# Patient Record
Sex: Male | Born: 1940 | Race: White | Hispanic: No | Marital: Married | Smoking: Former smoker
Health system: Southern US, Community
[De-identification: ages and names within clinical notes are randomized; demographics above are authoritative.]

## PROBLEM LIST (undated history)

## (undated) DIAGNOSIS — R05 Cough: Secondary | ICD-10-CM

## (undated) DIAGNOSIS — R059 Cough, unspecified: Secondary | ICD-10-CM

## (undated) DIAGNOSIS — I10 Essential (primary) hypertension: Secondary | ICD-10-CM

## (undated) DIAGNOSIS — I5032 Chronic diastolic (congestive) heart failure: Secondary | ICD-10-CM

## (undated) DIAGNOSIS — I495 Sick sinus syndrome: Secondary | ICD-10-CM

## (undated) DIAGNOSIS — C61 Malignant neoplasm of prostate: Secondary | ICD-10-CM

## (undated) DIAGNOSIS — J329 Chronic sinusitis, unspecified: Secondary | ICD-10-CM

## (undated) DIAGNOSIS — K219 Gastro-esophageal reflux disease without esophagitis: Secondary | ICD-10-CM

## (undated) DIAGNOSIS — I4892 Unspecified atrial flutter: Secondary | ICD-10-CM

## (undated) DIAGNOSIS — I2510 Atherosclerotic heart disease of native coronary artery without angina pectoris: Secondary | ICD-10-CM

## (undated) DIAGNOSIS — J45909 Unspecified asthma, uncomplicated: Secondary | ICD-10-CM

## (undated) DIAGNOSIS — Z972 Presence of dental prosthetic device (complete) (partial): Secondary | ICD-10-CM

## (undated) DIAGNOSIS — J209 Acute bronchitis, unspecified: Secondary | ICD-10-CM

## (undated) HISTORY — DX: Acute bronchitis, unspecified: J20.9

## (undated) HISTORY — PX: PACEMAKER INSERTION: SHX728

## (undated) HISTORY — DX: Cough, unspecified: R05.9

## (undated) HISTORY — PX: SINUS IRRIGATION: SHX2411

## (undated) HISTORY — DX: Cough: R05

## (undated) HISTORY — DX: Chronic sinusitis, unspecified: J32.9

---

## 2004-10-12 ENCOUNTER — Inpatient Hospital Stay: Payer: Self-pay

## 2004-10-12 ENCOUNTER — Other Ambulatory Visit: Payer: Self-pay

## 2006-05-13 HISTORY — PX: CARDIAC CATHETERIZATION: SHX172

## 2006-07-12 ENCOUNTER — Other Ambulatory Visit: Payer: Self-pay

## 2006-07-13 ENCOUNTER — Inpatient Hospital Stay: Payer: Self-pay | Admitting: Internal Medicine

## 2006-11-07 ENCOUNTER — Ambulatory Visit: Payer: Self-pay | Admitting: Internal Medicine

## 2006-11-17 ENCOUNTER — Other Ambulatory Visit: Payer: Self-pay

## 2006-11-17 ENCOUNTER — Inpatient Hospital Stay: Payer: Self-pay | Admitting: Internal Medicine

## 2006-12-02 ENCOUNTER — Other Ambulatory Visit: Payer: Self-pay

## 2006-12-02 ENCOUNTER — Inpatient Hospital Stay: Payer: Self-pay | Admitting: Internal Medicine

## 2007-02-04 ENCOUNTER — Other Ambulatory Visit: Payer: Self-pay

## 2007-02-04 ENCOUNTER — Emergency Department: Payer: Self-pay

## 2007-11-12 ENCOUNTER — Ambulatory Visit: Payer: Self-pay | Admitting: Physician Assistant

## 2008-04-20 ENCOUNTER — Emergency Department: Payer: Self-pay | Admitting: Unknown Physician Specialty

## 2008-07-06 ENCOUNTER — Ambulatory Visit: Payer: Self-pay | Admitting: Cardiology

## 2008-10-10 IMAGING — CR DG CHEST 1V PORT
1 series · 1 of 1 positions shown · non-contrast
Comparison: none

REASON FOR EXAM: Chest Pain
COMMENTS:

PROCEDURE:     DXR - DXR PORTABLE CHEST SINGLE VIEW  - November 17, 2006  [DATE]
RESULT:      No acute cardiopulmonary disease.

[view not recorded]
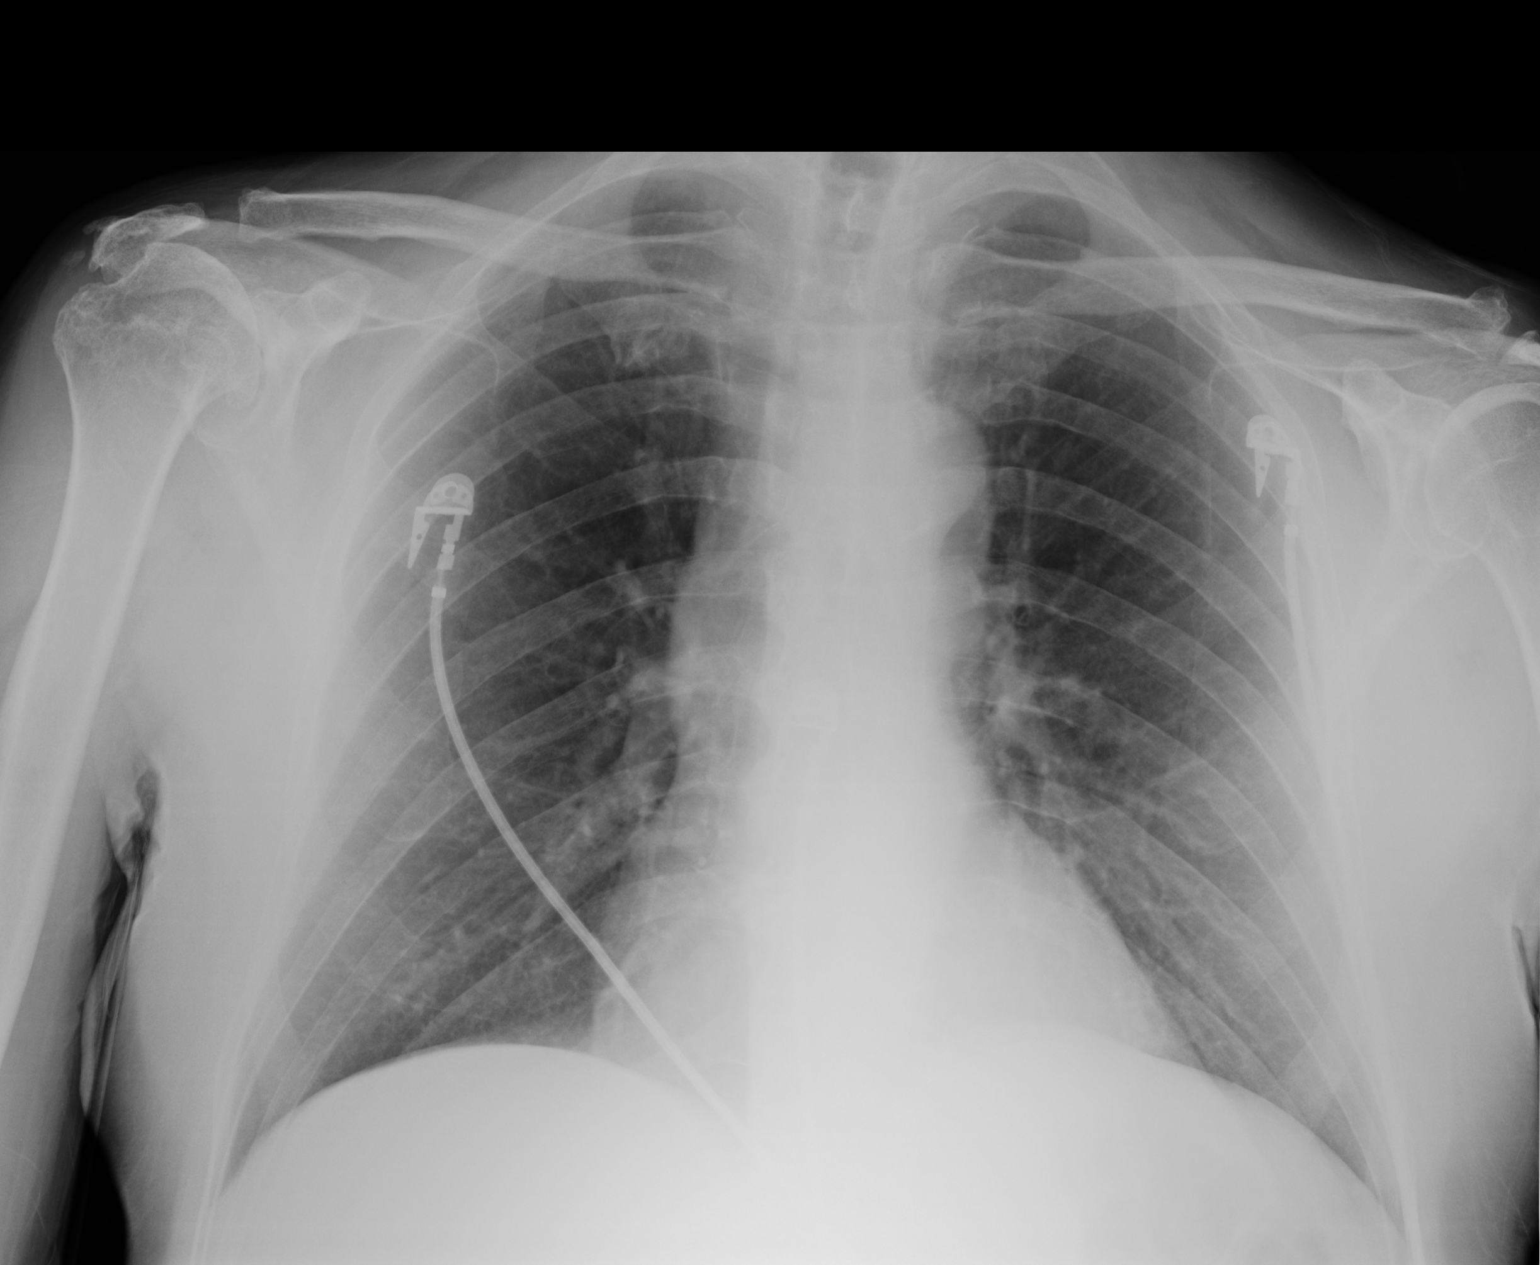

[1 of 1 positions shown; findings below may reference images not displayed]

IMPRESSION: No acute cardiopulmonary disease.

## 2009-05-13 DIAGNOSIS — Z950 Presence of cardiac pacemaker: Secondary | ICD-10-CM

## 2009-05-13 HISTORY — DX: Presence of cardiac pacemaker: Z95.0

## 2009-05-13 HISTORY — PX: INSERT / REPLACE / REMOVE PACEMAKER: SUR710

## 2009-06-15 ENCOUNTER — Ambulatory Visit: Payer: Self-pay | Admitting: Cardiology

## 2009-06-16 ENCOUNTER — Ambulatory Visit: Payer: Self-pay | Admitting: Cardiology

## 2009-08-04 ENCOUNTER — Ambulatory Visit: Payer: Self-pay | Admitting: Cardiology

## 2009-08-10 ENCOUNTER — Ambulatory Visit: Payer: Self-pay | Admitting: Cardiology

## 2011-12-31 ENCOUNTER — Ambulatory Visit: Payer: Self-pay | Admitting: Otolaryngology

## 2012-01-27 ENCOUNTER — Ambulatory Visit: Payer: Self-pay | Admitting: Otolaryngology

## 2012-02-03 ENCOUNTER — Ambulatory Visit: Payer: Self-pay | Admitting: Otolaryngology

## 2012-02-04 LAB — PATHOLOGY REPORT

## 2012-06-01 ENCOUNTER — Ambulatory Visit: Payer: Self-pay | Admitting: Urology

## 2012-06-01 LAB — BASIC METABOLIC PANEL
Calcium, Total: 9.2 mg/dL (ref 8.5–10.1)
Co2: 27 mmol/L (ref 21–32)
EGFR (Non-African Amer.): >60
Potassium: 4.4 mmol/L (ref 3.5–5.1)
Sodium: 139 mmol/L (ref 136–145)

## 2012-06-01 LAB — CBC WITH DIFFERENTIAL/PLATELET
Basophil #: 0.1 10*3/uL (ref 0.0–0.1)
Eosinophil #: 1 10*3/uL — ABNORMAL HIGH (ref 0.0–0.7)
HCT: 41.3 % (ref 40.0–52.0)
HGB: 14 g/dL (ref 13.0–18.0)
Lymphocyte %: 26.7 %
Monocyte #: 0.7 x10 3/mm (ref 0.2–1.0)
Monocyte %: 10.6 %
Neutrophil %: 47.6 %
Platelet: 262 10*3/uL (ref 150–440)
RBC: 4.69 10*6/uL (ref 4.40–5.90)
RDW: 13.9 % (ref 11.5–14.5)
WBC: 6.7 10*3/uL (ref 3.8–10.6)

## 2012-06-23 ENCOUNTER — Ambulatory Visit: Payer: Self-pay | Admitting: Otolaryngology

## 2012-07-06 ENCOUNTER — Encounter: Payer: Self-pay | Admitting: Pulmonary Disease

## 2012-07-07 ENCOUNTER — Encounter: Payer: Self-pay | Admitting: Pulmonary Disease

## 2012-07-07 ENCOUNTER — Ambulatory Visit (INDEPENDENT_AMBULATORY_CARE_PROVIDER_SITE_OTHER): Payer: PRIVATE HEALTH INSURANCE | Admitting: Pulmonary Disease

## 2012-07-07 VITALS — BP 126/80 | HR 63 | Temp 98.0°F | Ht 71.0 in | Wt 197.0 lb

## 2012-07-07 DIAGNOSIS — R059 Cough, unspecified: Secondary | ICD-10-CM

## 2012-07-07 DIAGNOSIS — R05 Cough: Secondary | ICD-10-CM

## 2012-07-07 DIAGNOSIS — K219 Gastro-esophageal reflux disease without esophagitis: Secondary | ICD-10-CM

## 2012-07-07 NOTE — Progress Notes (Signed)
Subjective:    Patient ID: Billy Glass, male    DOB: [birth year], 72 y.o.   MRN: [id number]  HPI  Billy Glass is a very pleasant 72 year old male referred to [place] by Dr. Andee Poles for evaluation of cough. He had a normal childhood without respiratory problems and has never been diagnosed with a respiratory illness until 2 years ago. He smoked less than one pack of cigarettes daily for 5-6 years and quit 44 years ago. 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  This workup did not involve significant exposure to dust, chemicals, or fumes. He enjoys performing yard work which sometimes exacerbates his cough and causes him to be exposed to a lot of dust. Otherwise he has no other significant hobbies which involved him being exposed to chemicals, dust, or fumes. He states that for the last 2 years he has had an ongoing cough which has been managed by both his primary care physician and Dr. Andee Poles. During this time he has been diagnosed with bronchitis on several occasions when he has been producing clear sputum. He is also been diagnosed with chronic sinusitis which has improved after a endoscopic sinus procedure in the late summer of 2013. His sinus symptoms have persisted however despite a nasal steroid and Singulair. He states that the cough has been nearly constant with intermittent flares with clear sputum production. It is worse when he lies down flat at night and lasts for about an hour. He occasionally produces sputum in the mornings. It is worse with heartburn and it is worse when his sinus symptoms are flaring. Most recently he was treated with Levaquin and was started on [facility] for cough. He states that the antibiotics helped in his cough has now improved significantly.   Past Medical History  Diagnosis Date  . Chronic sinusitis   . Acute bronchitis   . Cough      Family History  Problem Relation Age of Onset  . Hypertension Mother       History   Social History  . Marital Status: Married    Spouse Name: N/A    Number of Children: N/A  . Years of Education: N/A   Occupational History  . Not on file.   Social History Main Topics  . Smoking status: Former Smoker -- 1.00 packs/day for 6 years    Types: Cigarettes    Quit date: 05/13/1968  . Smokeless tobacco: Never Used  . Alcohol Use: No  . Drug Use: No  . Sexually Active: Not on file   Other Topics Concern  . Not on file   Social History Narrative  . No narrative on file     No Known Allergies   No outpatient prescriptions prior to visit.   No facility-administered medications prior to visit.      Review of Systems  Constitutional: Negative for fever, chills, activity change and appetite change.  HENT: Positive for congestion and postnasal drip. Negative for hearing loss, ear pain, rhinorrhea, sneezing, neck pain, neck stiffness and sinus pressure.   Eyes: Negative for redness, itching and visual disturbance.  Respiratory: Positive for cough. Negative for chest tightness, shortness of breath and wheezing.   Cardiovascular: Negative for chest pain, palpitations and leg swelling.  Gastrointestinal: Negative for nausea, vomiting, abdominal pain, diarrhea, constipation, blood in stool and abdominal distention.  Musculoskeletal: Negative for myalgias, joint swelling, arthralgias and gait problem.  Skin: Negative for rash.  Neurological: Negative for dizziness, light-headedness, numbness  and headaches.  Hematological: Does not bruise/bleed easily.  Psychiatric/Behavioral: Negative for confusion and dysphoric mood.       Objective:   Physical Exam  Filed Vitals:   07/07/12 1333  BP: 126/80  Pulse: 63  Temp: 98 F (36.7 C)  TempSrc: Oral  Height: 5\' 11"  (1.803 m)  Weight: 197 lb (89.359 kg)  SpO2: 96%   Gen: well appearing, no acute distress HEENT: NCAT, PERRL, EOMi, OP clear, neck supple without masses PULM: CTA B CV: RRR, mid systolic  murmur RUSB, no JVD AB: BS+, soft, nontender, no hsm Ext: warm, trace edema R > L, no clubbing, no cyanosis Derm: no rash or skin breakdown Neuro: A&Ox4, CN II-XII intact, strength 5/5 in all 4 extremities  04/2013 CXR >> emphysema upper lobes bilaterally; pacemaker in place, flat diaphragms     Assessment & Plan:   Cough Billy Glass chest x-ray is suggestive of emphysema but he has very little smoking history to explain this. Furthermore his lung function testing shows no clear evidence of obstruction. Based on this I think the most likely explanation for his cough is some degree of asthma with chronic sinusitis and untreated heartburn contributing.  Plan: -I agree with the combination steroid and long-acting beta agonist prescribed by Dr. Andee Poles. I have asked him to continue taking this. -Start over-the-counter Prilosec for heartburn which is likely contributing to cough -He was given instructions on GERD lifestyle modifications -Followup 6 weeks  Acid reflux Start Prilosec Lifestyle modifications    Updated Medication List Outpatient Encounter Prescriptions as of 07/07/2012  Medication Sig Dispense Refill  . ALPRAZolam (XANAX) 0.25 MG tablet Take 0.25 mg by mouth at bedtime as needed for sleep.      Marland Kitchen amLODipine (NORVASC) 5 MG tablet Take 5 mg by mouth daily.      . fluticasone (FLONASE) 50 MCG/ACT nasal spray Place 2 sprays into the nose daily.      Marland Kitchen gabapentin (NEURONTIN) 300 MG capsule Take 1 capsule by mouth 3 (three) times daily.      . montelukast (SINGULAIR) 10 MG tablet Take 1 tablet by mouth daily.      . simvastatin (ZOCOR) 40 MG tablet Take 1 tablet by mouth daily.      . tamsulosin (FLOMAX) 0.4 MG CAPS Take 1 capsule by mouth daily.       No facility-administered encounter medications on file as of 07/07/2012.

## 2012-07-07 NOTE — Assessment & Plan Note (Signed)
Start Prilosec Lifestyle modifications

## 2012-07-07 NOTE — Assessment & Plan Note (Addendum)
Billy Glass chest x-ray is suggestive of emphysema but he has very little smoking history to explain this. Furthermore his lung function testing shows no clear evidence of obstruction. Based on this I think the most likely explanation for his cough is some degree of asthma with chronic sinusitis and untreated heartburn contributing.  Plan: -I agree with the combination steroid and long-acting beta agonist prescribed by Dr. Andee Poles. I have asked him to continue taking this. -Start over-the-counter Prilosec for heartburn which is likely contributing to cough -He was given instructions on GERD lifestyle modifications -Followup 6 weeks

## 2012-07-07 NOTE — Patient Instructions (Signed)
Take over the counter prilosec (20mg  daily) every day until I see you next.  This is an antacid that will take 1-2 weeks to start working Follow the lifestyle modifications for acid reflux we have provided you Keep taking the [facility] and the other medications prescribed by Dr. Andee Poles  We will see you back in 6 weeks or sooner if needed

## 2012-07-07 NOTE — Progress Notes (Signed)
  Subjective:    Patient ID: Billy Glass, male    DOB: [birth year], 72 y.o.   MRN: [id number]  HPI    Review of Systems  Constitutional: Negative for fever and unexpected weight change.  HENT: Positive for congestion. Negative for ear pain, nosebleeds, sore throat, rhinorrhea, sneezing, trouble swallowing, dental problem, postnasal drip and sinus pressure.   Eyes: Negative for redness and itching.  Respiratory: Positive for wheezing. Negative for cough, chest tightness and shortness of breath.   Cardiovascular: Positive for leg swelling. Negative for palpitations.  Gastrointestinal: Negative for nausea and vomiting.  Genitourinary: Negative for dysuria.  Musculoskeletal: Negative for joint swelling.  Skin: Negative for rash.  Neurological: Negative for headaches.  Hematological: Does not bruise/bleed easily.  Psychiatric/Behavioral: Negative for dysphoric mood. The patient is not nervous/anxious.        Objective:   Physical Exam        Assessment & Plan:

## 2012-08-11 ENCOUNTER — Other Ambulatory Visit: Payer: Self-pay | Admitting: Pulmonary Disease

## 2012-08-11 ENCOUNTER — Encounter: Payer: Self-pay | Admitting: Pulmonary Disease

## 2012-08-11 ENCOUNTER — Ambulatory Visit (INDEPENDENT_AMBULATORY_CARE_PROVIDER_SITE_OTHER): Payer: Medicare Other | Admitting: Pulmonary Disease

## 2012-08-11 VITALS — BP 124/66 | HR 81 | Temp 97.8°F | Ht 71.0 in | Wt 187.0 lb

## 2012-08-11 DIAGNOSIS — R05 Cough: Secondary | ICD-10-CM

## 2012-08-11 DIAGNOSIS — G4733 Obstructive sleep apnea (adult) (pediatric): Secondary | ICD-10-CM

## 2012-08-11 DIAGNOSIS — Z9989 Dependence on other enabling machines and devices: Secondary | ICD-10-CM | POA: Insufficient documentation

## 2012-08-11 NOTE — Progress Notes (Signed)
Subjective:    Patient ID: Billy Glass, male    DOB: July 11, 1940, 72 y.o.   MRN: [id number]  Synopsis: Ahren Pettinger first saw the [facility] pulmonary clinic in February 2014 for evaluation of shortness of breath and cough. He had normal spirometry but improvement of his cough and symptom on Dulera.  He also reported improvement when he took albuterol.    HPI  08/11/2012 ROV - Mr. Fout says that his symptoms have improved since the last visit. His cough has improved but is still persistent somewhat. He has shortness of breath at night only when lying flat and using his CPAP machine. This has occurred 2-3 times a week for the last 6 weeks. He typically feels tightness in his chest and wheezing. He says that he will sit up, and uses albuterol inhaler and then feel relief.  On most evenings he has significant sinus congestion that makes his breathing worse. He frequently has to clear out significant amounts of mucus from his nose in the morning. He reports no shortness of breath during the daytime. He has not had chest pain in his chronic leg swelling is unchanged. He questions whether or not he is taking the [facility] appropriately as he says sometimes he doesn't feel that it's going into his lungs. He uses CPAP every night, but he says it has not been adjusted in 5 years.   Past Medical History  Diagnosis Date  . Chronic sinusitis   . Acute bronchitis   . Cough       Review of Systems  Constitutional: Negative for fever, chills and fatigue.  HENT: Positive for congestion, rhinorrhea and postnasal drip.   Respiratory: Positive for cough and shortness of breath. Negative for wheezing.   Cardiovascular: Positive for leg swelling. Negative for chest pain and palpitations.       Objective:   Physical Exam  Filed Vitals:   08/11/12 1347  BP: 124/66  Pulse: 81  Temp: 97.8 F (36.6 C)  TempSrc: Oral  Height: 5\' 11"  (1.803 m)  Weight: 187 lb (84.823 kg)  SpO2: 96%     Gen: well appearing, no acute distress HEENT: NCAT, EOMi, OP clear, TM's clear PULM: CTA B CV: RRR, slight systolic murmur, no JVD AB: BS+, soft, nontender, no hsm Ext: warm, compression stockings, no clubbing, no cyanosis      Assessment & Plan:   Cough This has improved somewhat since the last visit with [place].  I believe it is due to asthma and post nasal drip.  He and I question whether or not he is using his Dulera inhaler appropriately.  Plan: -start using Dulera with a spacer -see OSA -add nasal rinses -add chlortrimeton -add over the counter decongestant  OSA on CPAP He has been "smothering" at night when lying flat.  He says that a recent echocardiogram was normal, and he is not volume overloaded so I doubt heart failure here.  I suspect that this dyspnea is due in part to asthma (with ineffective delivery of Dulera), ongoing sinus congestion, and perhaps an inappropriate CPAP dose.  His pressure on CPAP is apparently only 3.5cm of water and has not been adjusted in the last 5 years.  Plan: -autotitrating CPAP machine at home with downloadable recording -use Dulera with a spacer -see cough    Updated Medication List Outpatient Encounter Prescriptions as of 08/11/2012  Medication Sig Dispense Refill  . ALPRAZolam (XANAX) 0.25 MG tablet 1/2 tablet twice daily      . amLODipine (  NORVASC) 5 MG tablet Take 5 mg by mouth daily.      . fluticasone (FLONASE) 50 MCG/ACT nasal spray Place 2 sprays into the nose daily.      Marland Kitchen gabapentin (NEURONTIN) 300 MG capsule Take 1 capsule by mouth 3 (three) times daily.      . mometasone-formoterol (DULERA) 100-5 MCG/ACT AERO Inhale 2 puffs into the lungs 2 (two) times daily.      Marland Kitchen omeprazole (PRILOSEC OTC) 20 MG tablet Take 20 mg by mouth daily.      . simvastatin (ZOCOR) 40 MG tablet Take 1 tablet by mouth daily.      . tamsulosin (FLOMAX) 0.4 MG CAPS Take 1 capsule by mouth daily.      . [DISCONTINUED] montelukast (SINGULAIR) 10  MG tablet Take 1 tablet by mouth daily.       No facility-administered encounter medications on file as of 08/11/2012.

## 2012-08-11 NOTE — Patient Instructions (Signed)
Use Neil Med rinses with distilled water at least twice per day using the instructions on the package. 1/2 hour after using the [facility] Med rinse, use Flonase two puffs in each nostril once per day. Use chlortrimeton and an over the counter decongestant (phenylephrine or pseudophed) as needed for the sinus congestion. You can use saline gel in your nostrils before going to bed Use a spacer with your Dulera inhaler twice a day We will set up the auto-titrating CPAP machine to use in your home to help get your CPAP pressure correct.   We will see you back in 3-4 months or sooner if needed

## 2012-08-11 NOTE — Assessment & Plan Note (Addendum)
This has improved somewhat since the last visit with [place].  I believe it is due to asthma and post nasal drip.  He and I question whether or not he is using his Dulera inhaler appropriately.  Plan: -start using Dulera with a spacer -see OSA -add nasal rinses -add chlortrimeton -add over the counter decongestant

## 2012-08-11 NOTE — Assessment & Plan Note (Signed)
He has been "smothering" at night when lying flat.  He says that a recent echocardiogram was normal, and he is not volume overloaded so I doubt heart failure here.  I suspect that this dyspnea is due in part to asthma (with ineffective delivery of Dulera), ongoing sinus congestion, and perhaps an inappropriate CPAP dose.  His pressure on CPAP is apparently only 3.5cm of water and has not been adjusted in the last 5 years.  Plan: -autotitrating CPAP machine at home with downloadable recording -use Dulera with a spacer -see cough

## 2012-08-12 ENCOUNTER — Ambulatory Visit: Payer: Medicare Other | Admitting: Pulmonary Disease

## 2012-08-18 ENCOUNTER — Ambulatory Visit: Payer: Medicare Other | Admitting: Pulmonary Disease

## 2012-12-08 ENCOUNTER — Telehealth: Payer: Self-pay | Admitting: Pulmonary Disease

## 2012-12-08 NOTE — Telephone Encounter (Signed)
Spoke with Burna Mortimer at [facility] about pt compliance with CPAP. [facility] sent order for purchase of CPAP--Medicare did not receive compliance report.  Cannot find in chart where you states that pt has been compliant with CPAP. Medicare is needing either documentation from you on 08/2012 visit that pt is complaint with CPAP or a D/L of compliance from [facility]. Medicare has already spoken with [facility] and they do not have a D/L of compliance to give them  Please advise Dr Kendrick Fries. Thanks.

## 2012-12-08 NOTE — Telephone Encounter (Signed)
In our last visit I wanted him to have a Resmed 9 Autotitrating CPAP set up at home with 5-20cm H20 and 4 week download; I haven't received a download. Can you check to see if any of this has been set up?  That would be how I would get his compliance report.  Thanks

## 2012-12-09 NOTE — Telephone Encounter (Signed)
I have sent [facility] a staff message to get this faxed over to [place]. Will forward to [place] to look out for results.

## 2012-12-15 NOTE — Telephone Encounter (Signed)
I do not see anything here in [place] in triage until results are received

## 2012-12-15 NOTE — Telephone Encounter (Signed)
Please advise if results have been received, thanks!!

## 2012-12-15 NOTE — Telephone Encounter (Signed)
I have not seen these results. I have looked through Dr. Ulyses Jarred "look at" and "scan folder" and it's not there. Will send this message to Verlon Au to see if this has been taken to [facility] for BQ today.  Another staff message will be sent to [facility] to get these results faxed over to [place].

## 2012-12-17 NOTE — Telephone Encounter (Signed)
These results are in BQ's look at folder. When there is another [facility] day, he will get to look at it then.

## 2012-12-17 NOTE — Telephone Encounter (Signed)
There is nothing in triage. Will forward to [place] to see if it was ever received.

## 2012-12-22 ENCOUNTER — Telehealth: Payer: Self-pay | Admitting: *Deleted

## 2012-12-22 ENCOUNTER — Encounter: Payer: Self-pay | Admitting: Pulmonary Disease

## 2012-12-22 DIAGNOSIS — G4733 Obstructive sleep apnea (adult) (pediatric): Secondary | ICD-10-CM

## 2012-12-22 NOTE — Telephone Encounter (Signed)
I think 7-11 is better, thanks

## 2012-12-22 NOTE — Telephone Encounter (Signed)
The order had been sent for auto device 7-20  Should we change this to 7-11  Please advise, and then I will call and discuss with the pt Thanks!

## 2012-12-22 NOTE — Telephone Encounter (Signed)
Message copied by Christen Butter on Tue Dec 22, 2012 10:14 AM ------      Message from: Max Fickle B      Created: Tue Dec 22, 2012  8:56 AM       L,            Can we make sure that his Autotitrating CPAP settings are 7-11cm H20.            Also, please remind him to keep his CPAP machine on as long as possible every night.  His average usage each night was only about three hours and it really should be higher.            Thanks,      B ------

## 2012-12-23 NOTE — Telephone Encounter (Signed)
Spoke with pt and notified of recs per BQ and he verbalized understanding  Order sent to [facility] to decrease pressure  The pt c/o having increased dyspnea OV with BQ scheduled for 12/29/12 Call sooner if needed

## 2012-12-29 ENCOUNTER — Ambulatory Visit (INDEPENDENT_AMBULATORY_CARE_PROVIDER_SITE_OTHER)
Admission: RE | Admit: 2012-12-29 | Discharge: 2012-12-29 | Disposition: A | Discharge status: Home / Self Care | Payer: PRIVATE HEALTH INSURANCE | Source: Ambulatory Visit | Attending: Pulmonary Disease | Admitting: Pulmonary Disease

## 2012-12-29 ENCOUNTER — Ambulatory Visit (INDEPENDENT_AMBULATORY_CARE_PROVIDER_SITE_OTHER): Payer: PRIVATE HEALTH INSURANCE | Admitting: Pulmonary Disease

## 2012-12-29 ENCOUNTER — Encounter: Payer: Self-pay | Admitting: Pulmonary Disease

## 2012-12-29 ENCOUNTER — Encounter: Payer: Self-pay | Admitting: *Deleted

## 2012-12-29 VITALS — BP 122/70 | HR 65 | Ht 71.0 in | Wt 182.0 lb

## 2012-12-29 DIAGNOSIS — R05 Cough: Secondary | ICD-10-CM

## 2012-12-29 DIAGNOSIS — G4733 Obstructive sleep apnea (adult) (pediatric): Secondary | ICD-10-CM

## 2012-12-29 DIAGNOSIS — J45901 Unspecified asthma with (acute) exacerbation: Secondary | ICD-10-CM

## 2012-12-29 MED ORDER — DOXYCYCLINE HYCLATE 50 MG PO CAPS: 100.0000 mg | ORAL_CAPSULE | Freq: Two times a day (BID) | ORAL | Status: AC

## 2012-12-29 MED ORDER — HYDROCOD POLST-CHLORPHEN POLST 10-8 MG/5ML PO LQCR: 5.0000 mL | Freq: Two times a day (BID) | ORAL | Status: DC | PRN

## 2012-12-29 MED ORDER — PREDNISONE 10 MG PO TABS: ORAL_TABLET | ORAL | Status: DC

## 2012-12-29 NOTE — Assessment & Plan Note (Signed)
Continue CPAP at 9cm H20

## 2012-12-29 NOTE — Patient Instructions (Signed)
Take the prednisone taper as written Take the doxycycline 100mg  twice a day for 7 days, take it with yogurt We will send you for a chest x-ray at the [facility] office  Use over the counter phenylephrine and saline sprays or Lloyd Huger med rinse to help with the sinus congestion  Use the tussionex at night to help with the cough.  We will see you back in 3 weeks or sooner if needed

## 2012-12-29 NOTE — Progress Notes (Signed)
Subjective:    Patient ID: Billy Glass, male    DOB: [birth year], 72 y.o.   MRN: [id number]  Synopsis: Billy Glass first saw the [facility] pulmonary clinic in February 2014 for evaluation of shortness of breath and cough. He had normal spirometry but improvement of his cough and symptom on Dulera.  He also reported improvement when he took albuterol.    HPI   08/11/2012 ROV - Billy Glass says that his symptoms have improved since the last visit. His cough has improved but is still persistent somewhat. He has shortness of breath at night only when lying flat and using his CPAP machine. This has occurred 2-3 times a week for the last 6 weeks. He typically feels tightness in his chest and wheezing. He says that he will sit up, and uses albuterol inhaler and then feel relief.  On most evenings he has significant sinus congestion that makes his breathing worse. He frequently has to clear out significant amounts of mucus from his nose in the morning. He reports no shortness of breath during the daytime. He has not had chest pain in his chronic leg swelling is unchanged. He questions whether or not he is taking the [facility] appropriately as he says sometimes he doesn't feel that it's going into his lungs. He uses CPAP every night, but he says it has not been adjusted in 5 years.  12/29/2012 ROV >> Since the last visit Billy Glass has been having lots of cough lately, and having a hard time breathing.  He stopped using his treadmill because of this and has been noticing the symptoms for over a month.  The coughing spells are bad, feels like he is smothering when he lies flat.  He initially felt the dyspnea increasing when he was on his treadmill.  Not sure if he is having bronchitis or asthma and denies sick contacts or new environmental exposures. When he coughs, nothing is coming up, he has been using cough syrup but it doesn't help, lots of cough drops   Past Medical History  Diagnosis Date   . Chronic sinusitis   . Acute bronchitis   . Cough      Review of Systems  Constitutional: Negative for fever, chills and fatigue.  HENT: Positive for congestion, rhinorrhea and postnasal drip.   Respiratory: Positive for cough and shortness of breath. Negative for wheezing.   Cardiovascular: Positive for leg swelling. Negative for chest pain and palpitations.       Objective:   Physical Exam   Filed Vitals:   12/29/12 1009  BP: 122/70  Pulse: 65  Height: 5\' 11"  (1.803 m)  Weight: 182 lb (82.555 kg)  SpO2: 96%    Gen: frequent cough HEENT: NCAT, EOMi, OP clear, TM's clear PULM: wheezing bilaterally, normal percussoin CV: RRR, slight systolic murmur, no JVD AB: BS+, soft, nontender, no hsm Ext: warm, compression stockings, no clubbing, no cyanosis      Assessment & Plan:   Acute asthma flare It sounds like Billy Glass is having a flare of asthma with some bronchitis, but fortunately he is not too short of breath.  Cough is the major symptom.  Plan: -Chest X-ray -off work rest of week -tussionex for cough (don't drive with this on board -prednisone taper -doxycycline for a week with yogurt -repeat full PFT's on follow up in one month  OSA on CPAP Continue CPAP at 9cm H20    Updated Medication List Outpatient Encounter Prescriptions as of 12/29/2012  Medication Sig Dispense  Refill  . ALPRAZolam (XANAX) 0.25 MG tablet 1/2 tablet twice daily      . amLODipine (NORVASC) 5 MG tablet Take 5 mg by mouth daily.      . fluticasone (FLONASE) 50 MCG/ACT nasal spray Place 2 sprays into the nose daily.      Marland Kitchen gabapentin (NEURONTIN) 300 MG capsule Take 1 capsule by mouth 3 (three) times daily.      . mometasone-formoterol (DULERA) 100-5 MCG/ACT AERO Inhale 2 puffs into the lungs 2 (two) times daily.      . montelukast (SINGULAIR) 10 MG tablet Take 1 tablet by mouth daily.      Marland Kitchen omeprazole (PRILOSEC OTC) 20 MG tablet Take 20 mg by mouth daily.      . simvastatin (ZOCOR) 40  MG tablet Take 1 tablet by mouth daily.      . tamsulosin (FLOMAX) 0.4 MG CAPS Take 1 capsule by mouth daily.       No facility-administered encounter medications on file as of 12/29/2012.

## 2012-12-29 NOTE — Assessment & Plan Note (Signed)
It sounds like Billy Glass is having a flare of asthma with some bronchitis, but fortunately he is not too short of breath.  Cough is the major symptom.  Plan: -Chest X-ray -off work rest of week -tussionex for cough (don't drive with this on board -prednisone taper -doxycycline for a week with yogurt -repeat full PFT's on follow up in one month

## 2013-01-19 ENCOUNTER — Ambulatory Visit: Payer: PRIVATE HEALTH INSURANCE | Admitting: Pulmonary Disease

## 2013-02-02 ENCOUNTER — Ambulatory Visit: Payer: PRIVATE HEALTH INSURANCE | Admitting: Pulmonary Disease

## 2013-02-16 ENCOUNTER — Ambulatory Visit (INDEPENDENT_AMBULATORY_CARE_PROVIDER_SITE_OTHER): Payer: PRIVATE HEALTH INSURANCE | Admitting: Pulmonary Disease

## 2013-02-16 ENCOUNTER — Encounter: Payer: Self-pay | Admitting: Pulmonary Disease

## 2013-02-16 VITALS — BP 110/70 | HR 66 | Ht 71.0 in | Wt 182.0 lb

## 2013-02-16 DIAGNOSIS — J45909 Unspecified asthma, uncomplicated: Secondary | ICD-10-CM | POA: Insufficient documentation

## 2013-02-16 DIAGNOSIS — J309 Allergic rhinitis, unspecified: Secondary | ICD-10-CM

## 2013-02-16 DIAGNOSIS — R0602 Shortness of breath: Secondary | ICD-10-CM

## 2013-02-16 NOTE — Progress Notes (Signed)
Subjective:    Patient ID: Billy Glass, male    DOB: 04/13/1941, 72 y.o.   MRN: [id number]  Synopsis: Billy Glass first saw the [facility] pulmonary clinic in February 2014 for evaluation of shortness of breath and cough. He had normal spirometry but improvement of his cough and symptom on Dulera.  He also reported improvement when he took albuterol.    HPI  08/11/2012 ROV - Billy Glass says that his symptoms have improved since the last visit. His cough has improved but is still persistent somewhat. He has shortness of breath at night only when lying flat and using his CPAP machine. This has occurred 2-3 times a week for the last 6 weeks. He typically feels tightness in his chest and wheezing. He says that he will sit up, and uses albuterol inhaler and then feel relief.  On most evenings he has significant sinus congestion that makes his breathing worse. He frequently has to clear out significant amounts of mucus from his nose in the morning. He reports no shortness of breath during the daytime. He has not had chest pain in his chronic leg swelling is unchanged. He questions whether or not he is taking the [facility] appropriately as he says sometimes he doesn't feel that it's going into his lungs. He uses CPAP every night, but he says it has not been adjusted in 5 years.  12/29/2012 ROV >> Since the last visit Billy Glass has been having lots of cough lately, and having a hard time breathing.  He stopped using his treadmill because of this and has been noticing the symptoms for over a month.  The coughing spells are bad, feels like he is smothering when he lies flat.  He initially felt the dyspnea increasing when he was on his treadmill.  Not sure if he is having bronchitis or asthma and denies sick contacts or new environmental exposures. When he coughs, nothing is coming up, he has been using cough syrup but it doesn't help, lots of cough drops  02/16/2013 ROV >> Billy Glass says that his  breathing is better since the last time, but he is having a cough at night still. He says that the inhaler Elwin Sleight) is helping a lot and he is using it regularly.  He is not longer short of breath.  He is still having a lot of sinus congestion despite regular Flonase use.  He tried using nasacort a few months ago and it helped some but he ran out and hasn't bought a new one.  Past Medical History  Diagnosis Date  . Chronic sinusitis   . Acute bronchitis   . Cough      Review of Systems  Constitutional: Negative for chills and fatigue.  HENT: Positive for congestion, postnasal drip and rhinorrhea.   Respiratory: Positive for cough. Negative for shortness of breath and wheezing.   Cardiovascular: Negative for palpitations and leg swelling.       Objective:   Physical Exam   Filed Vitals:   02/16/13 1500  BP: 110/70  Pulse: 66  Height: 5\' 11"  (1.803 m)  Weight: 182 lb (82.555 kg)  SpO2: 96%    Gen: frequent cough HEENT: NCAT, EOMi, OP clear, TM's clear PULM: CTA B CV: RRR, slight systolic murmur, no JVD AB: BS+, soft, nontender, no hsm Ext: warm, compression stockings, no clubbing, no cyanosis      Assessment & Plan:   Intrinsic asthma Billy Glass has had a particularly rough stretch this year. His shortness of breath  has improved but I believe that he has asthma that is being exacerbated by an underlying allergen of some sort that we have not been able to identify. Is allergic rhinitis is also continuing to contribute. Unfortunately, he is not taking his sinus medicines like he should.  Plan: -see sinuses -Allergy referral -Continue Dulera  Allergic rhinitis -Continue Singulair -Allergy referral -Instructed to restart Nasacort and use daily as needed -Continue saline rinses -Advised to restart oral antihistamines such as Zyrtec    Updated Medication List Outpatient Encounter Prescriptions as of 02/16/2013  Medication Sig Dispense Refill  . ALPRAZolam (XANAX) 0.25  MG tablet 1/2 tablet twice daily      . amLODipine (NORVASC) 5 MG tablet Take 5 mg by mouth daily.      . fluticasone (FLONASE) 50 MCG/ACT nasal spray Place 2 sprays into the nose daily.      Marland Kitchen gabapentin (NEURONTIN) 300 MG capsule Take 1 capsule by mouth 3 (three) times daily.      . mometasone-formoterol (DULERA) 100-5 MCG/ACT AERO Inhale 2 puffs into the lungs 2 (two) times daily.      . montelukast (SINGULAIR) 10 MG tablet Take 1 tablet by mouth daily.      Marland Kitchen omeprazole (PRILOSEC OTC) 20 MG tablet Take 20 mg by mouth daily.      . simvastatin (ZOCOR) 40 MG tablet Take 1 tablet by mouth daily.      . tamsulosin (FLOMAX) 0.4 MG CAPS Take 1 capsule by mouth daily.      . [DISCONTINUED] chlorpheniramine-HYDROcodone (TUSSIONEX) 10-8 MG/5ML LQCR Take 5 mL by mouth every 12 (twelve) hours as needed.  140 mL  2  . [DISCONTINUED] predniSONE (DELTASONE) 10 MG tablet Take 40mg  po daily for 3 days, then take 30mg  po daily for 3 days, then take 20mg  po daily for two days, then take 10mg  po daily for 2 days  30 tablet  0   No facility-administered encounter medications on file as of 02/16/2013.

## 2013-02-16 NOTE — Patient Instructions (Signed)
We will make an appointment for you to see an allergist here in town for you allergic rhinitis. In the meantime, be sure to take the following regularly: -Nasacort -Saline rinses -Zyrtec, Allegra, or Claritn daily (any one of these is fine, generic is fine) -singulair  We will set up repeat PFT's and call you with the results.  We will see you back in 4-6 months or sooner if needed

## 2013-02-16 NOTE — Assessment & Plan Note (Signed)
-  Continue Singulair -Allergy referral -Instructed to restart Nasacort and use daily as needed -Continue saline rinses -Advised to restart oral antihistamines such as Zyrtec

## 2013-02-16 NOTE — Assessment & Plan Note (Signed)
Billy Glass has had a particularly rough stretch this year. His shortness of breath has improved but I believe that he has asthma that is being exacerbated by an underlying allergen of some sort that we have not been able to identify. Is allergic rhinitis is also continuing to contribute. Unfortunately, he is not taking his sinus medicines like he should.  Plan: -see sinuses -Allergy referral -Continue Elwin Sleight

## 2013-02-18 ENCOUNTER — Telehealth: Payer: Self-pay | Admitting: Pulmonary Disease

## 2013-02-18 NOTE — Telephone Encounter (Signed)
LMTCB

## 2013-02-19 NOTE — Telephone Encounter (Signed)
Called [facility], spoke with [place].  They received OV notes on this pt on 02/17/13 but no orders for anything.  Looks like pt has a cpap but looks like this is managed through [facility].  I do not see anything in pt's chart where we are changing DMEs.  PCCs, do you have anything on this patient by any chance?  If [facility] doesn't need to do anything further and can disregard the OV notes, no call back is needed.

## 2013-02-19 NOTE — Telephone Encounter (Signed)
[facility] advised to discard the ov notes she received  Do not find that anything was ment to go to them [employer]

## 2013-03-30 ENCOUNTER — Ambulatory Visit: Payer: Self-pay | Admitting: Surgery

## 2013-03-30 DIAGNOSIS — I10 Essential (primary) hypertension: Secondary | ICD-10-CM

## 2013-03-30 LAB — CBC
HCT: 40.3 % (ref 40.0–52.0)
HGB: 14 g/dL (ref 13.0–18.0)
MCHC: 34.7 g/dL (ref 32.0–36.0)
Platelet: 283 10*3/uL (ref 150–440)
WBC: 4.9 10*3/uL (ref 3.8–10.6)

## 2013-04-06 ENCOUNTER — Ambulatory Visit: Payer: Self-pay | Admitting: Surgery

## 2014-08-30 NOTE — Op Note (Signed)
PATIENT NAME:  Billy Glass, Billy Glass MR#:  [id number] DATE OF BIRTH:  05-19-1940  DATE OF PROCEDURE:  02/03/2012  PREOPERATIVE DIAGNOSES:  1. Chronic rhinosinusitis. 2. Nasal obstruction.   POSTOPERATIVE DIAGNOSES: 1. Chronic rhinosinusitis. 2. Nasal obstruction.   PROCEDURES PERFORMED:  1. Right maxillary antrostomy with tissue removal.  2. Left maxillary antrostomy with tissue removal.  3. Right frontal sinusotomy.  4. Left frontal sinusotomy.  5. Right total ethmoidectomy.  6. Left total ethmoidectomy.  7. Image-guided sinus surgery.   SURGEON: Jerene Bears, MD   ANESTHESIA: General endotracheal anesthesia.   ESTIMATED BLOOD LOSS: 150.  IV FLUIDS: Please see anesthesia record.   COMPLICATIONS: None.   DRAINS/STENT PLACEMENTS: Stammberger sinufoam and Nasopore.   SPECIMENS: Right and left sinus contents.   INDICATIONS FOR PROCEDURE: The patient is a 74 year old male with history of chronic rhinosinusitis resistant to medical management found to have persistent maxillary ethmoid and frontal sinus disease on CT scan.   OPERATIVE FINDINGS:  1. Bilateral nasal polyposis with severe tissue edema.  2. Successful wide maxillary antrostomies with tissue removal.  3. Successful bilateral frontal sinusitis and total ethmoids.   DESCRIPTION OF PROCEDURE: After the patient was identified in holding, benefits and risks of the procedure were discussed and consent was reviewed. The patient was taken to the operating room and placed in the supine position. General endotracheal anesthesia was induced. The patient was rotated 90 degrees. Stryker image-guided sinus system was set up and calibrated in normal fashion with less than 1 mm of error and the patient was prepped and draped in a sterile fashion. A 0 degree endoscope was brought into the field. 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 There is significant nasal polyposis with infracturing of the uncinate process bilaterally. There was significant tissue edema. Attention at this time was directed to the patient's left frontal sinus using a balloon catheter from Impelis. The patient's left frontal sinus was identified and the frontal sinus outflow tract was identified. A seeker light was placed into the patient's left frontal sinus and the balloon was dilated for approximately 5 seconds and this was repeated x1 and this was removed from the patient's nasal cavity. Next, nasal polyposis from the sinus outflow tract was identified and this was removed. The frontal sinus outflow tract was enlarged with straight biters. Care was taken to avoid circumferential injury. Attention at this point was directed to the patient's right frontal sinus. In a similar fashion, the Impelis balloon dilation system was inserted into the patient's right frontal sinus just posterior to the uncinate process. The seeker light was in the patient's forehead and this tract was dilated x2. Purulent drainage came out and significant polyposis was noted along the medial and anterior aspect of the sinus outflow tract. Using a 30 and 70 degree scope, the nasal polyposis was removed with straight forceps and 45 degree up-biting forceps and the sinus outflow tract was enlarged for a widely patent right maxillary sinus outflow tract. At this time attention was directed to the patient's maxillary sinuses. Again, Impelis balloon system was used to initially dilate. There was significant mild mucosal edema and nasal polyposis around the posterior aspect of the uncinate. This was removed with a Diego microdebrider and a wide maxillary antrostomy was created. With tissue removal bilaterally, the Stryker image-guided system was used throughout the duration to ensure proper positioning of instruments. There  was a moderate amount of mucosal edema on the  floor of the maxillary sinus. At this time attention was directed to the patient's left total ethmoids. The anterior ethmoid bulla was entered in the medial and inferior aspect with a straight image-guided suction and Diego microdebrider was used to take down the anterior ethmoids from a medial to lateral and inferior to superior method. The posterior ethmoids were entered also in a similar fashion with the straight suction. Significant amount of polyposis and mucosal edema was encountered and attention was directed to the patient's right total ethmoids. At this time again ethmoid bulla was entered in the medial and inferior aspect of the ethmoid bulla. The ethmoid cells were crushed using the straight suction and the small pieces were removed using a Diego microdebrider. Care was taken to avoid injury to the ethmoid roof as well as lamina papyracea. The posterior lamella was encountered and this was entered through a medial and inferior aspect. This was osteitic bone on the patient's right side with significant polyposis in the posterior ethmoids. This was removed with straight suction as well as Diego microdebrider. At this time meticulous hemostasis was achieved. The patient's nasal cavities were copiously irrigated with sterile saline. Surgifoam was placed along the posterior ethmoids and then Nasopore was placed along the lateral aspect of the middle turbinate. Care of the patient at this time was transferred to anesthesia.   ____________________________ Jerene Bears, MD [id number]:[id number] D: 02/03/2012 11:41:05 ET T: 02/03/2012 11:56:57 ET JOB#: [id number]  cc: Jerene Bears, MD, <Dictator> Jerene Bears MD ELECTRONICALLY SIGNED 02/15/2012 9:56

## 2014-09-02 NOTE — Op Note (Signed)
PATIENT NAME:  Billy Glass, Billy Glass MR#:  [id number] DATE OF BIRTH:  Mar 01, 1941  DATE OF PROCEDURE:  04/06/2013  PREOPERATIVE DIAGNOSIS: Umbilical hernia.   POSTOPERATIVE DIAGNOSIS: Umbilical hernia.   PROCEDURE: Umbilical hernia repair.   SURGEON: Rochel Brome, MD  ANESTHESIA: General.   INDICATION: This 74 year old male has come in for evaluation of a bulge at the navel. It has been there for about 20 years but recently increased in size. Says he used to go down at night, but it does not go down at night anymore.   On physical exam, he was found to have a bulge at the navel of 4 cm in dimension which was minimally tender and was not reducible. The site was incarcerated. It appeared to be chronically incarcerated, likely containing fat. Surgery was recommended for definitive treatment.   DESCRIPTION OF PROCEDURE: The patient was placed on the operating table in the supine position under general anesthesia. The abdomen was clipped and prepared with ChloraPrep and draped in a sterile manner.   The mass was some 4 cm in size. An infraumbilical, transversely oriented curvilinear incision was made some 4 cm in length, carried down through a thin layer of subcutaneous tissues to encounter the herniated properitoneal fat. This was dissected free from surrounding structures. It was incarcerated and was necessary to lengthen the fascial incision at the right end to allow reduction and that could subsequently be reduced. Next, properitoneal fat was dissected away from the fascial ring defect. The fascia appeared to have satisfactory thickness for holding sutures. The Atrium mesh was selected and cut to create an oval shape of some 2 x 3 cm. This was placed transversely oriented in the properitoneal plane and sutured to the overlying fascia with 0 Surgilon sutures. Next, the defect was closed with a transversely oriented suture line of interrupted 0 Surgilon figure-of-eight sutures incorporating mesh into each  suture. The repair looked good. Hemostasis was intact. The fascia and subcutaneous tissues were infiltrated with 0.5% Sensorcaine with epinephrine. The adventitia of the skin of the umbilicus was sutured to the deep fascia with interrupted 4-0 chromic sutures, and the skin was closed with running 4-0 Monocryl subcuticular suture and Dermabond. The patient tolerated surgery satisfactorily and was then prepared for transfer to the recovery room. ____________________________ Lenna Sciara. Rochel Brome, MD jws:sb D: 04/06/2013 10:42:07 ET T: 04/06/2013 10:55:21 ET JOB#: [id number]  cc: Loreli Dollar, MD, <Dictator> Loreli Dollar MD ELECTRONICALLY SIGNED 04/06/2013 18:50

## 2014-11-22 IMAGING — CR DG CHEST 2V
2 series · 2 of 2 positions shown · non-contrast
Comparison: None.

CLINICAL DATA: Cough

CHEST - 2 VIEW

[view not recorded (1 of 2)]
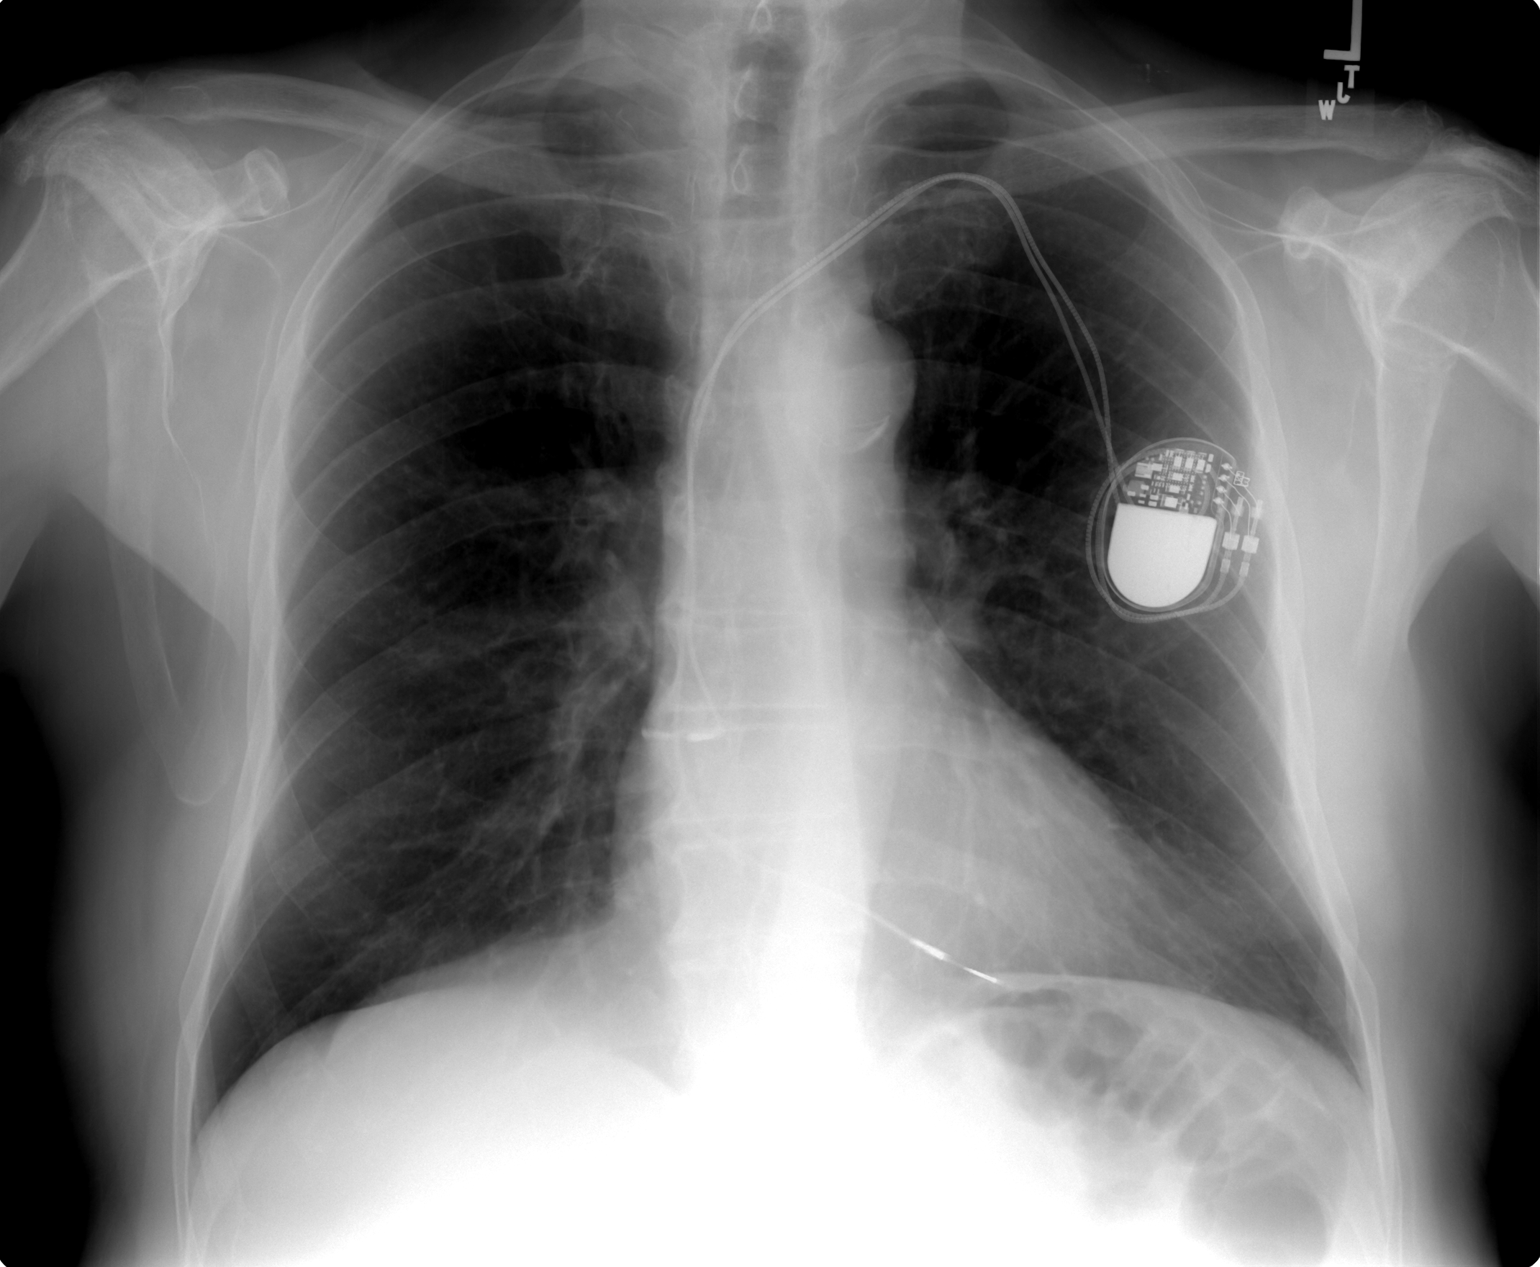

[view not recorded (2 of 2)]
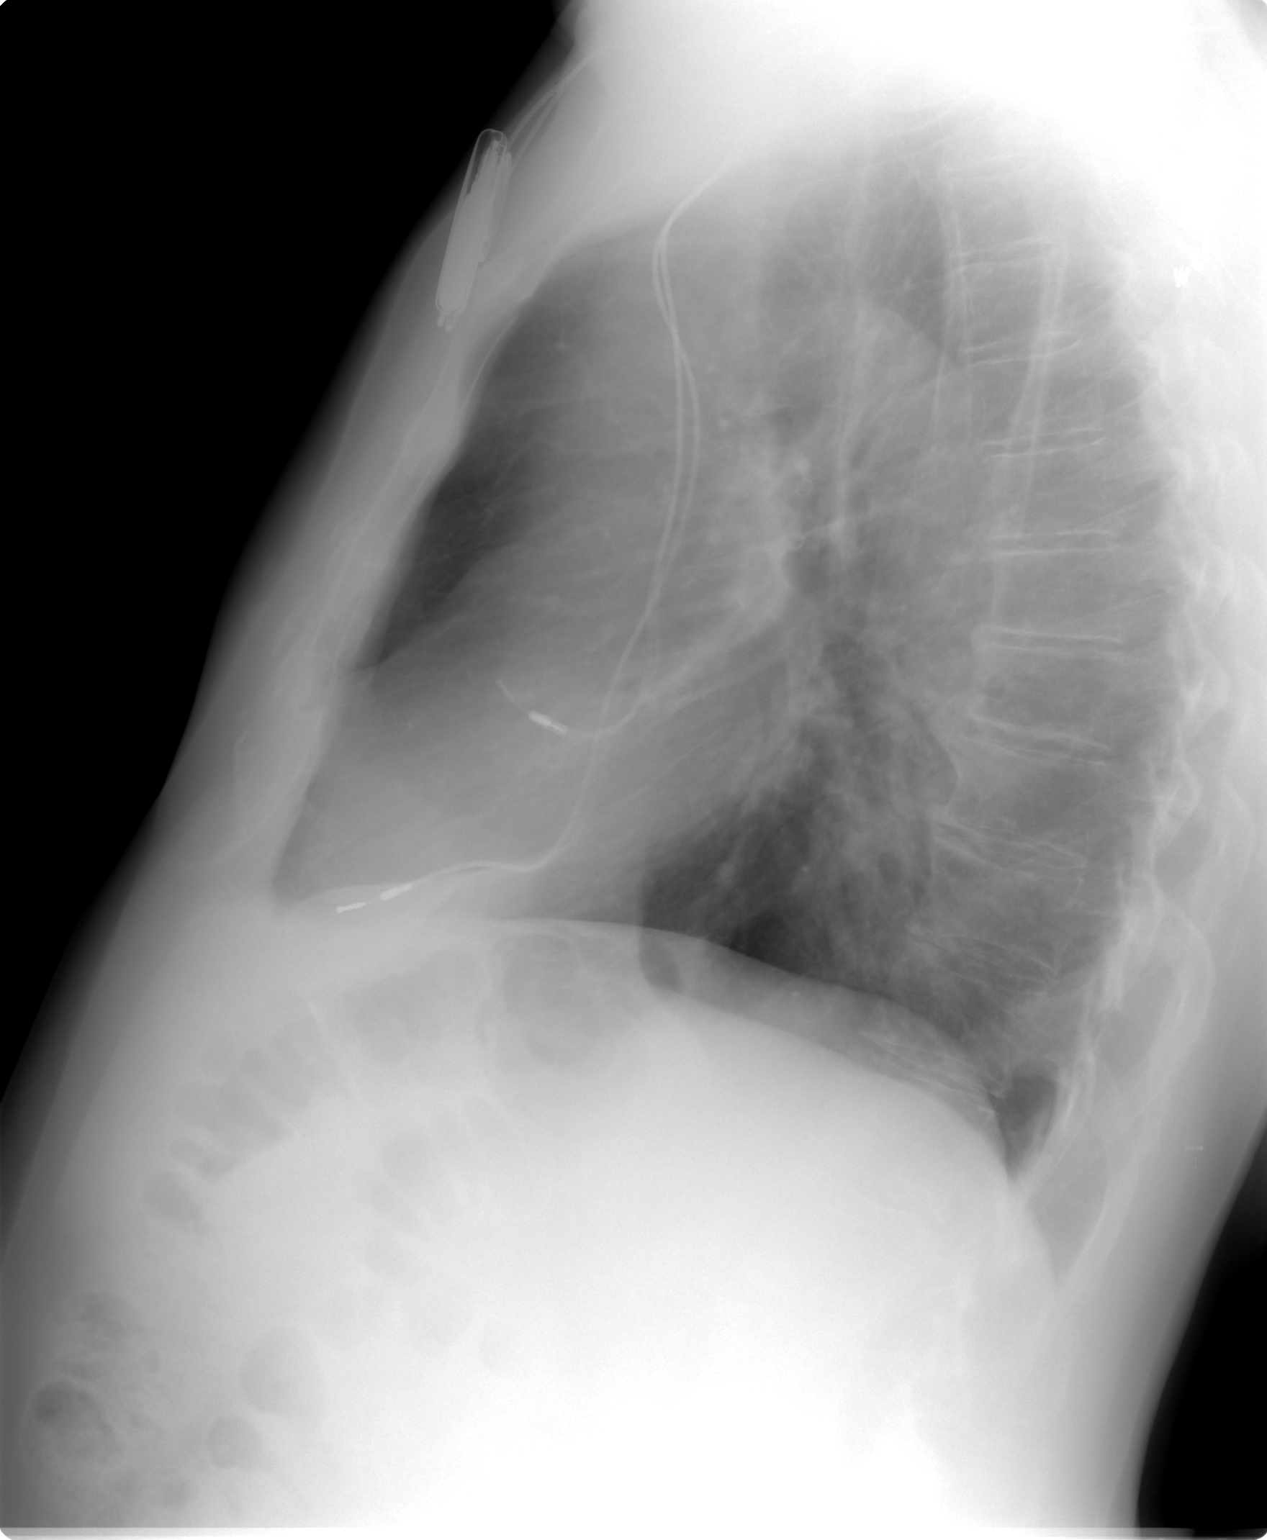

[2 of 2 positions shown; findings below may reference images not displayed]

FINDINGS: There is underlying emphysematous change.  There is no
edema or consolidation.  The heart size and pulmonary vascularity
are within normal limits.  Pacemaker leads are attached to the
right atrium and right ventricle.  There is degenerative change in
the thoracic spine.  No adenopathy.  There is atherosclerotic
change in the aortic arch region.
IMPRESSION: Underlying emphysematous change.  No edema or consolidation.

## 2015-01-03 ENCOUNTER — Other Ambulatory Visit: Payer: Self-pay | Admitting: Gastroenterology

## 2015-01-03 DIAGNOSIS — K635 Polyp of colon: Secondary | ICD-10-CM

## 2015-01-03 DIAGNOSIS — K5730 Diverticulosis of large intestine without perforation or abscess without bleeding: Secondary | ICD-10-CM

## 2015-01-20 ENCOUNTER — Ambulatory Visit
Admission: RE | Admit: 2015-01-20 | Discharge: 2015-01-20 | Disposition: A | Discharge status: Home / Self Care | Payer: Commercial Managed Care - HMO | Source: Ambulatory Visit | Attending: Gastroenterology | Admitting: Gastroenterology

## 2015-01-20 DIAGNOSIS — K635 Polyp of colon: Secondary | ICD-10-CM

## 2015-01-20 DIAGNOSIS — K5730 Diverticulosis of large intestine without perforation or abscess without bleeding: Secondary | ICD-10-CM

## 2016-08-30 NOTE — Progress Notes (Signed)
 Procedure note  Procedure: Transrectal ultrasound and ultrasound-guided prostate needle biopsy with MRI fusion.  Indication: Billy Glass 76 y.o. with elevated PSA and HGPIN. MRI-US  fusion guided biopsy recommended for previous negative biopsy. Additional detail in prior urology notes.   He previously underwent multi-parametric MRI of the prostate. The images and report are available in PACS.  Both the expert radiologist and I have looked at this study carefully, including the T2 phase, the diffusion weighted imaging phase, and the dynamic contrast enhancement phase.   The mpMRI demonstrated PIRADS 5 lesion at right base near SV.  Also PIRADS 3 lesion.  Preparation: Enema and oral antibiotic prep adequate (oral Levaquin 500 mg and ceftriaxone 1 gram IM).  Time out was performed immediately prior to the procedure.   Procedure: The patient was placed in the left lateral decubitus knee-chest position. DRE revealed a mildly enlarged nontender prostate without nodularity, approximately 56.5 grams.  The electromagnetic field generator which is used to perform image guided biopsies was attached to the table and positioned over the hip. The ultrasound probe, specially fitted with an EM needle tracking guide, was placed into the rectum. The TRUS probe was first used to guide placement of a bilateral periprostatic nerve block utilizing a total of 20 cc of 1% lidocaine  bilaterally at the apex and base.   The prostate was then evaluated with ultrasound:  Estimated volume: AP 36.8 mm x 36.8 mm x 44.3 mm, est vol 48.7 cc   Seminal vesicles: normal.  Hypoechoic lesions: small area with calcifications noted in right base.  During the procedure on an independent software fusion platform, the [employer] system with [employer], 3-D gland segmentation was done with the ultrasound fused to the 3-D segmentation of the MRI with a semi-automatic technique prior to targeted biopsy. The lesions that were  suspicious for cancer on the preoperative MRI were called up as targets and overlaid on the real-time ultrasound image and sampled with an 18-gauge spring loaded needle biopsy gun. I felt that sampling was successful.  Co-registration Quality:   Excellent  Targeting Quality:    Very Good  Cognitive Biopsy:    No   Next, 12 prostate biopsies were taken in the standard schematic fashion. The patient tolerated the procedure well without immediate complications.   Impression: 76 y.o. y.o.-year-old man with elevated PSA and previous negative biopsy.  Core summary: 6 targets 12 systematics  Plan:  1. F/u biopsy result. If malignant, patient will visit the multidisciplinary oncology clinic.  2. Prostate biopsy protocol antibiotics have been completed.  3. If negative, return to us  in 6 months for further monitoring. 4. Postbiopsy instructions given.

## 2016-12-13 IMAGING — CT CT VIRTUAL COLONOSCOPY DIAGNOSTIC
4 of 9 series · 12 of 36 positions shown, 18 images · non-contrast
Comparison: None.

CLINICAL DATA: Incomplete optical colonoscopy 12/08/2014 due to
colonic tortuosity. Benign polyps removed and 1 hyperplastic polyp.

EXAM:
CT VIRTUAL COLONOSCOPY DIAGNOSTIC
TECHNIQUE: The patient was given a standard LoSo bowel preparation with
Gastrografin and barium for fluid and stool tagging respectively.
The quality of the bowel preparation is moderate poor. Automated CO2
insufflation of the colon was performed prior to image acquisition
and colonic distention is moderate excellent. Image post processing
was used to generate a 3D endoluminal fly-through projection of the
colon and to electronically subtract stool/fluid as appropriate.

[Series 2: supine (id) · axial · 0.78mm/px · z∈[-366,-85]mm · 4 of 375 slices shown]
[im 75/375  soft-tissue]
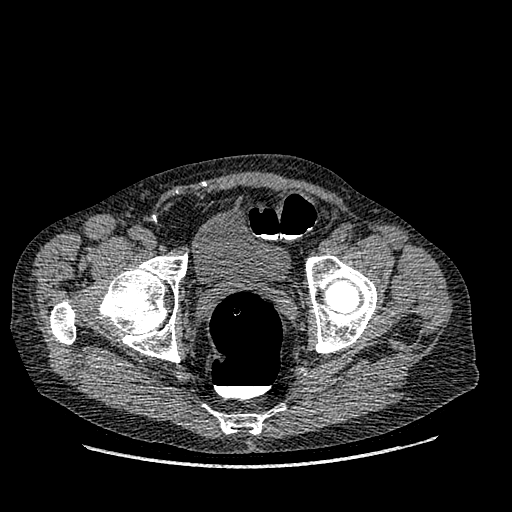
[im 150/375  soft-tissue]
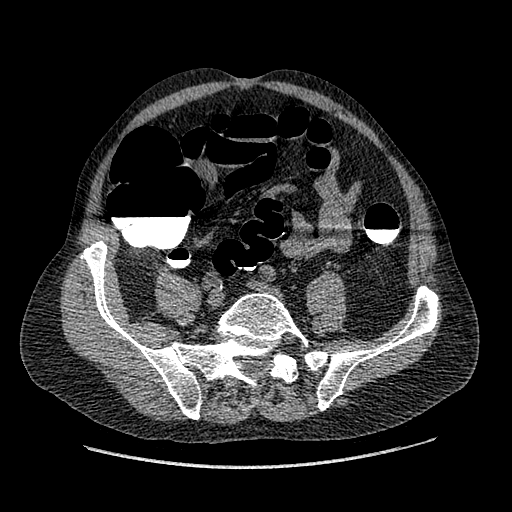
[im 225/375  soft-tissue]
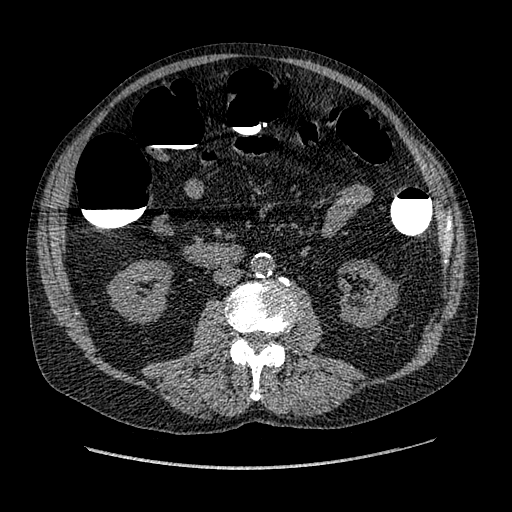
[im 300/375  soft-tissue]
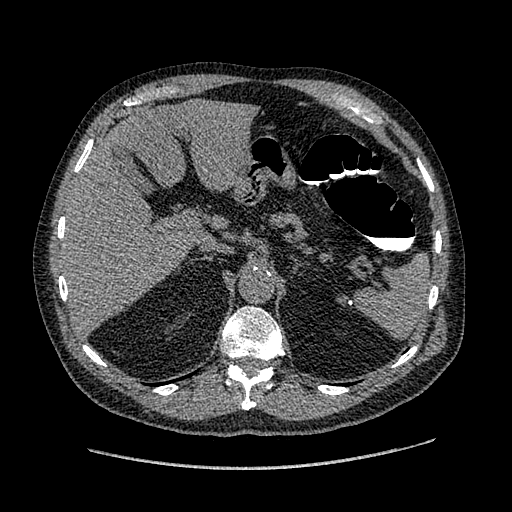

[Series 6: prone (id) · axial · 0.78mm/px · z∈[-344,+1]mm · 5 of 416 slices shown, 10 images]
[im 70/416  soft-tissue]
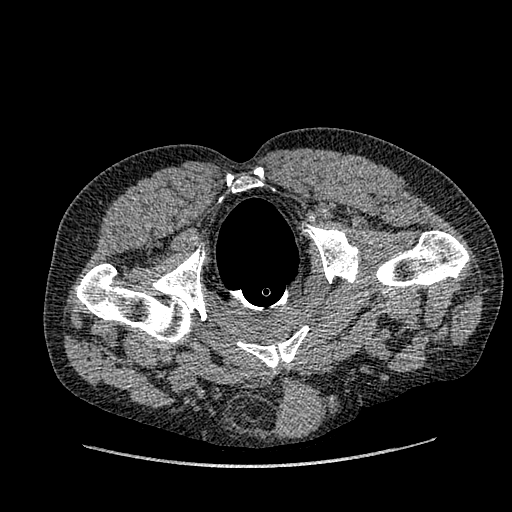
[im 70/416  bone]
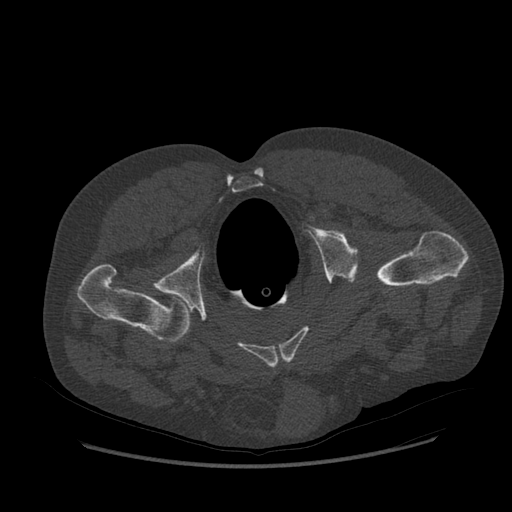
[im 139/416  soft-tissue]
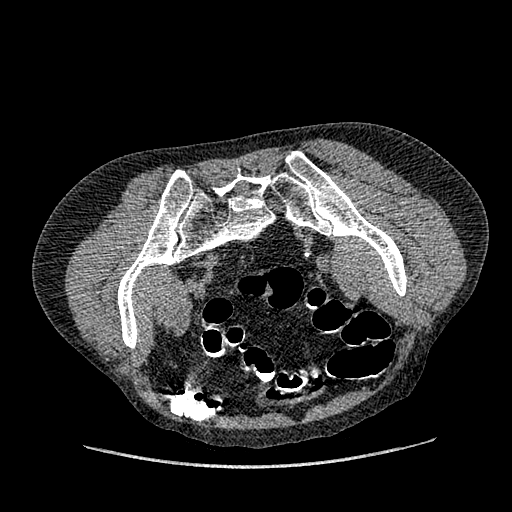
[im 139/416  lung]
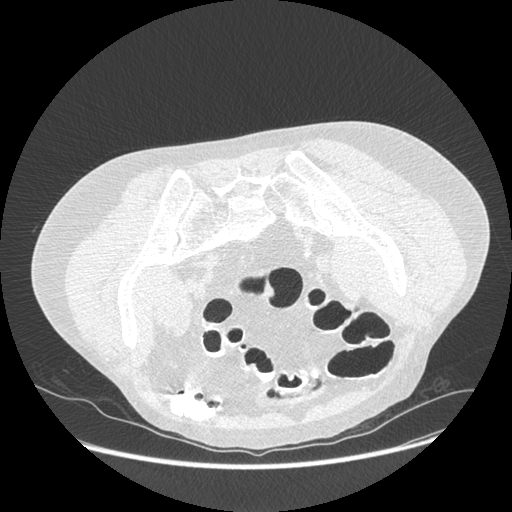
[im 208/416  soft-tissue]
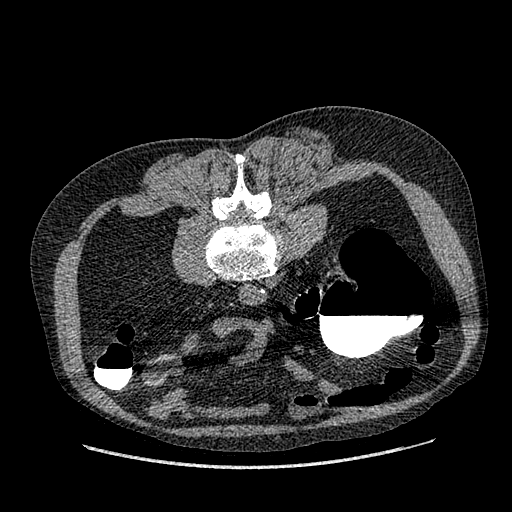
[im 208/416  lung]
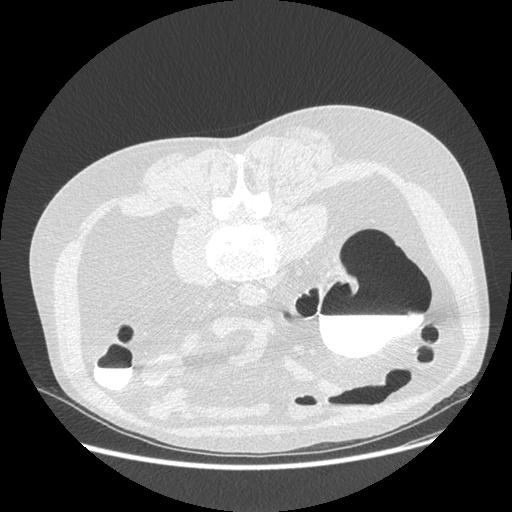
[im 277/416  soft-tissue]
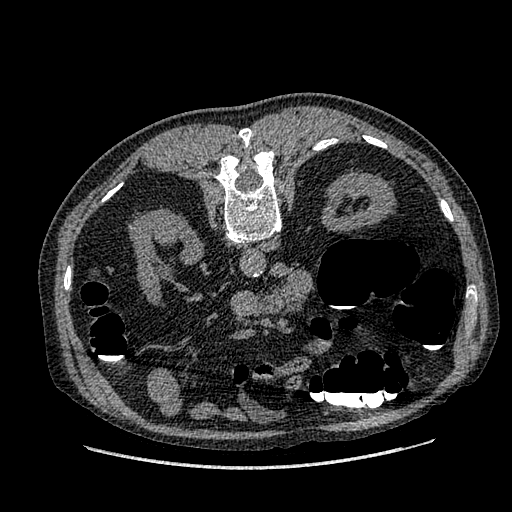
[im 277/416  lung]
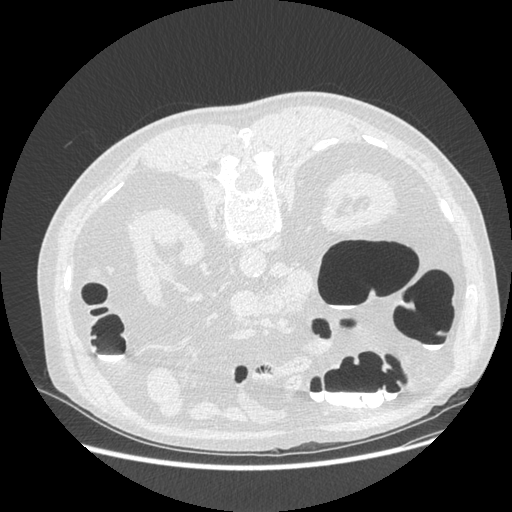
[im 346/416  soft-tissue]
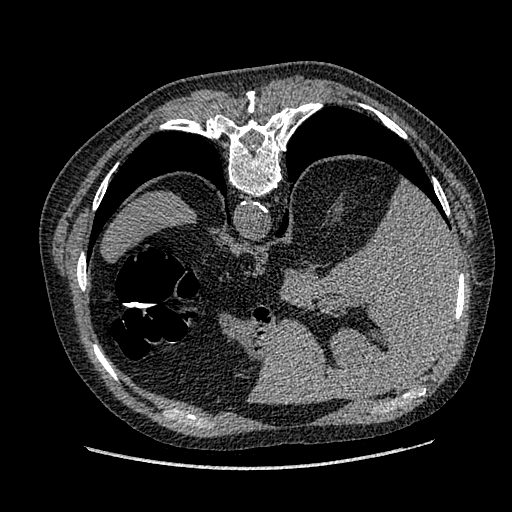
[im 346/416  lung]
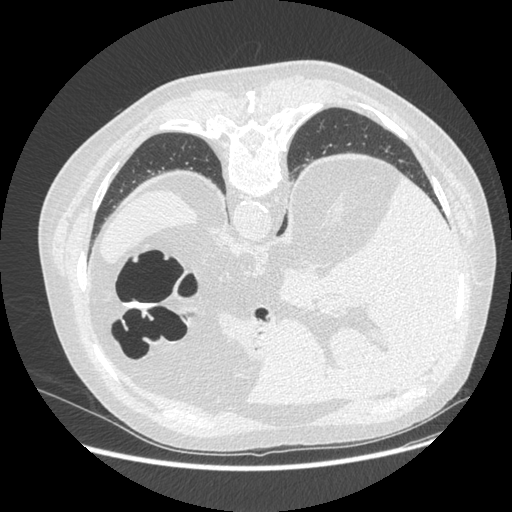

[Series 9: right decub 1.25 · axial · 0.78mm/px · z∈[-311,-217]mm · 2 of 376 slices shown]
[im 76/376  soft-tissue]
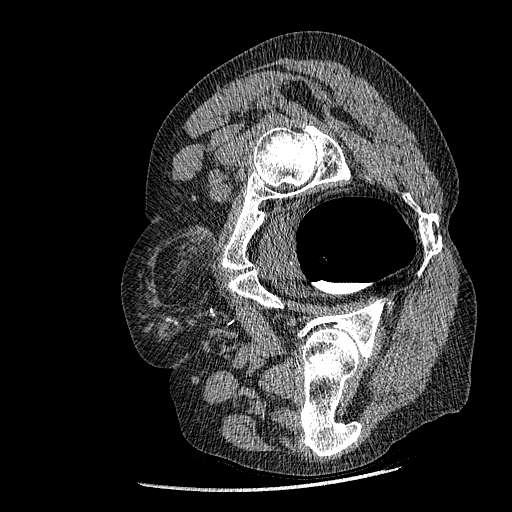
[im 151/376  soft-tissue]
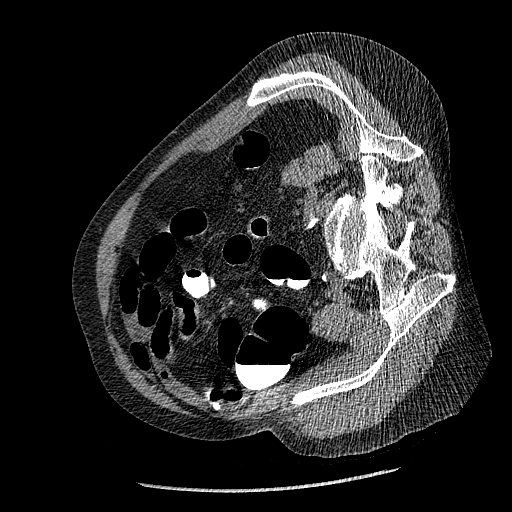

[Series 601: coronal body · coronal · 0.91mm/px · 1 of 132 slices shown, 2 images]
[im 44/132  soft-tissue]
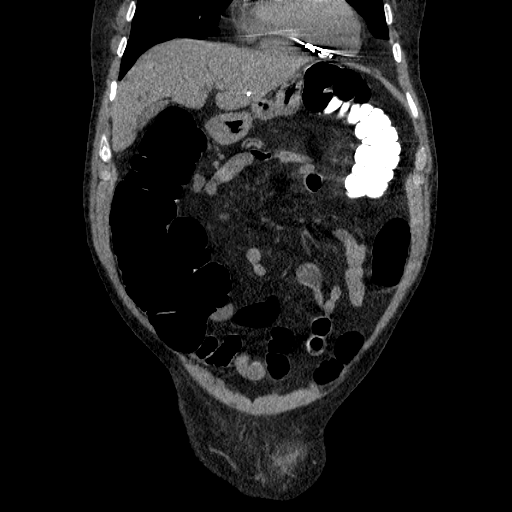
[im 44/132  bone]
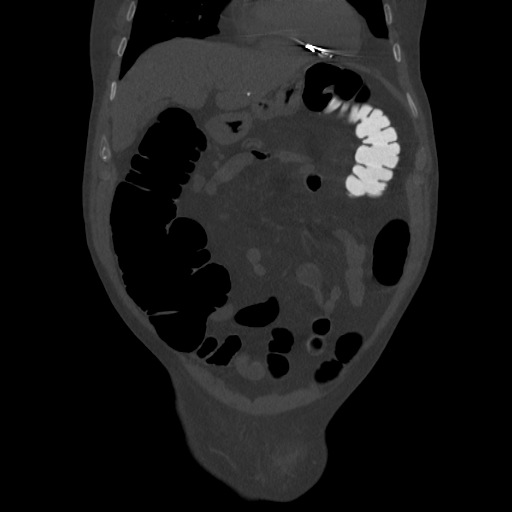

[12 of 36 positions shown; findings below may reference images not displayed]

FINDINGS: Diagnostic quality is limited by a fair amount of retained fluid. No
definite polyp, mass or stricture.
IMPRESSION: Diagnostic quality is limited by a fair amount of retained fluid. No
definite polyp, mass or stricture.

Virtual colonoscopy is not designed to detect diminutive polyps
(i.e., less than or equal to 5 mm), the presence or absence of which
may not affect clinical management.

CT ABDOMEN AND PELVIS WITHOUT CONTRAST
FINDINGS: Lower chest: Lung bases show no acute findings. Heart size normal.
No pericardial or pleural effusion.

Hepatobiliary: Low-attenuation lesions in the liver measure up to
3.5 cm and are difficult to definitively characterize without
post-contrast imaging. Liver and gallbladder are otherwise
unremarkable. No biliary ductal dilatation.

Pancreas: Negative.

Spleen: Splenule, negative.

Adrenals/Urinary Tract: Adrenal glands are unremarkable. Small
stones are seen in the kidneys bilaterally. Low-attenuation lesion
in the left renal sinus measures 3.1 cm, likely a cyst although
definitive characterization is difficult without post-contrast
imaging. Ureters are decompressed. Bladder is unremarkable.

Stomach/Bowel: Moderate hiatal hernia. Stomach and small bowel are
otherwise unremarkable. Colon is discussed in the virtual
colonoscopy section of this report. Additional note is made that
unobstructed colon is seen within a left inguinal hernia.

Vascular/Lymphatic: Atherosclerotic calcification of the arterial
vasculature without abdominal aortic aneurysm. No pathologically
enlarged lymph nodes.

Reproductive: Prostate is at the upper limits of normal in size.

Other: Right inguinal hernia repair. Left inguinal hernia contains
unobstructed colon. No free fluid. Mesenteries and peritoneum are
otherwise unremarkable.

Musculoskeletal: No worrisome lytic or sclerotic lesions.
Degenerative changes are seen in the spine.

1. Small bilateral renal stones.
2. Moderate hiatal hernia.
3. Left inguinal hernia contains unobstructed colon.

## 2016-12-25 NOTE — Progress Notes (Signed)
 [facility] UROLOGY [facility] [address] [place], [address]  Office Visit  Patient Name: Billy Glass  Date of  Visit: 12/25/2016   Date of Birth: 01-28-1941  Assessment/Plan:   1. T1c adenocarcinoma the prostate, low risk  Benign DRE. PSA ordered today and if stable he desires to continue surveillance. He will follow-up in 4 months. Will schedule confirmation biopsy at next follow-up.   History of Present Illness:   Chief Complaint: Prostate cancer follow-up  The patient is a 76 y.o. male who presents today for follow-up of prostate cancer.  He underwent MRI fusion biopsy on 08/28/2016 for a PIRADS 5 lesion at the right base and a PIRADS 3 lesion left mid gland. Both of these target lesions were negative for prostate cancer and showed benign tissue. He did however have Gleason 3+3 adenocarcinoma at the right apex involving 10% of the submitted tissue. Since his last visit he underwent bilateral robotic laparoscopic hernia repair by Dr. Jerilee. He has no voiding complaints.  Allergies Lisinopril and Losartan  Medications   Outpatient Encounter Prescriptions as of 12/25/2016  Medication Sig Dispense Refill  . acetaminophen  (TYLENOL ) 500 MG tablet Take by mouth every four (4) hours as needed for pain.    SABRA amLODIPine (NORVASC) 2.5 MG tablet Take 1 tablet (2.5 mg total) by mouth daily. 90 tablet 3  . aspirin (ECOTRIN) 81 MG tablet Take 81 mg by mouth.    . calcium carbonate-vitamin D2 500 mg(1,250mg ) -200 unit tablet Take 2 tablets by mouth Two (2) times a day.    . cetirizine (ZYRTEC) 10 MG tablet Take 10 mg by mouth daily.    . citalopram (CELEXA) 20 MG tablet Take 1 tablet (20 mg total) by mouth daily. 90 tablet 3  . DYMISTA 137-50 mcg/spray Spry 1 spray by Each Nare route Two (2) times a day.     SABRA econazole nitrate (SPECTAZOLE) 1 % cream Apply topically Two (2) times a day. 30 g 3  . gabapentin (NEURONTIN) 300 MG capsule Take 1 capsule (300 mg total) by mouth Three (3)  times a day. 270 capsule 3  . Lactobacillus rhamnosus GG (CULTURELLE) 10 billion cell capsule Take 2 capsules by mouth daily.    . miscellaneous medical supply Misc Dx - OSA   Rx  -  Mask for cpap machinne 1 each 0  . montelukast (SINGULAIR) 10 mg tablet Take 1 tablet (10 mg total) by mouth nightly. Frequency:QD   Dosage:10   MG  Instructions:  Note:Dose: 10MG  90 tablet 3  . nitroglycerin (NITROSTAT) 0.4 MG SL tablet Place 1 tablet (0.4 mg total) under the tongue every five (5) minutes as needed for chest pain. 25 tablet 5  . potassium chloride SA (K-DUR,KLOR-CON) 20 MEQ tablet Take 20 mEq by mouth Two (2) times a day.    . pravastatin (PRAVACHOL) 40 MG tablet Take 1 tablet (40 mg total) by mouth daily. 90 tablet 3  . SYMBICORT 160-4.5 mcg/actuation inhaler     . tamsulosin (FLOMAX) 0.4 mg capsule Take 1 capsule (0.4 mg total) by mouth daily. 90 capsule 3  . tiotropium bromide (SPIRIVA RESPIMAT) 2.5 mcg/actuation Mist Inhale 1 puff Two (2) times a day.     . VENTOLIN HFA 90 mcg/actuation inhaler daily as needed.     . vitamin E 400 UNIT capsule Take 400 Units by mouth daily.     No facility-administered encounter medications on file as of 12/25/2016.     Past Medical History Past Medical History:  Diagnosis Date  . Arthritis   . Asthma   . Coronary artery disease   . Elevated prostate specific antigen (PSA)   . GERD (gastroesophageal reflux disease)   . Gross hematuria   . Hyperlipidemia   . Hypertension   . Hypertrophy of prostate with urinary obstruction and other lower urinary tract symptoms (LUTS)   . Neoplasm of uncertain behavior of prostate   . Sleep apnea     Past Surgical History Past Surgical History:  Procedure Laterality Date  . CARDIAC SURGERY     pacemaker  . HERNIA REPAIR    . PR COLONOSCOPY W/BIOPSY SINGLE/MULTIPLE N/A 12/08/2014   Procedure: COLONOSCOPY, FLEXIBLE, PROXIMAL TO SPLENIC FLEXURE; WITH BIOPSY, SINGLE OR MULTIPLE;  Surgeon: Lauraine Burnard Dry, MD;   [facility];  Service: Gastroenterology  . PR LAP,INGUINAL HERNIA REPR,INITIAL Bilateral 10/21/2016   Procedure: ROBOTIC LAPAROSCOPY, SURGICAL; REPAIR INITIAL INGUINAL HERNIA;  Surgeon: Alm Lemond Grieves, MD;  Location: [facility];  Service: General Surgery  . PROSTATE SURGERY    . SHOULDER SURGERY Right    rotator cuff repair  . SINUS SURGERY      Family History family history includes Breast cancer in his sister; Hyperlipidemia in his mother; Hypertension in his mother.  Social History: Tobacco use:   reports that he quit smoking about 48 years ago. He has never used smokeless tobacco. Alcohol use:   reports that he does not drink alcohol. Drug use:  reports that he does not use drugs.  Review of Systems A 12 point review of systems was negative except for pertinent items noted in the HPI and on the patient intake form.  The form has been reviewed with the patient or caregiver.  Objective   Vital Signs Vitals:   12/25/16 0950  BP: 155/71  BP Site: L Arm  BP Position: Sitting  BP Cuff Size: X-Large  Pulse: 85  SpO2: 99%  Weight: 83 kg (183 lb)  Height: 182.9 cm (6')    Physical Exam Mental Status- Alert. Cooperative, Consistent with stated age. Oriented 3. Well-nourished, Well developed. Gait- Normal. Hydration- Well hydrated. Voice- Normal.  Integumentary: General Characteristics:No rashes. Normal coloration of skin. Normal skin moisture.  Head and Neck: Global Assessment- atraumatic, normocephalic, atraumatic with no gross lesions.  No abnormal facial aesthetics. No abnormal movements. Eyeball- Bilateral- Normal. Sclera/Conjunctiva- Bilateral- normal. Nose - symmetric. Nares- symmetric. Lips: Normal color, moist, no gross lesions.  Chest and Lung Exam: Chest - normal excursion with symmetric chest walls, quiet, even and easy respiratory effort with no use of accessory muscles.  No abnormal breath sounds  noted.  Cardiovascular: Cardiovascular - regular rate and rhythm.  Abdomen: General: Soft, Nontender  Rectal: External: Normal external exam. Internal: Normal internal exam. Prostate: Nontender. Minimal firmness left prostate which is stable. Size: 50 cm. Seminal vesical: Nonpalpable.  Neurologic: Motor and sensory grossly intact. Affect- normal and appropriate. Speech- Normal. Thought content- Normal. Cognitive function- Normal. Coordination- Normal.   Musculoskeletal: Posture- Normal.  Musculoskeletal strength and tone grossly normal.   Test Results Results for orders placed or performed in visit on 12/25/16  POCT Urinalysis Dipstick  Result Value Ref Range   Spec Gravity/POC 1.025 1.003 - 1.030   PH/POC 6.0 5.0 - 9.0   Leuk Esterase/POC Negative Negative   Nitrite/POC Negative Negative   Protein/POC Negative Negative   UA Glucose/POC Negative Negative   Ketones, POC Negative Negative   Bilirubin/POC Negative Negative   Blood/POC Negative Negative  Urobilinogen/POC 0.2 0.2 - 1.0 mg/dL       Billy Glass

## 2019-07-29 NOTE — Progress Notes (Signed)
 Prostate ultrasound and Biopsy  Informed consent obtained. Pre-procedural prep completed with enema and antibiotics. Procedure performed in lateral decubitus position with knees tucked.  Indication for biopsy: Active surveillance, low risk GGG1 T1c  Prior biopsy: 08/30/2016 - GGG1 in 1 core, 2mm, 10% of total core length.   PSA: 8.85 DRE: normal MRI volume (2018): 74.44 ml PSAd: 0.12 TRUS volume:  H (24.6) W (46.1) L (30.8) = 18.3 mL The prostate was imaged in both the transverse and sagittal planes.  TRUS findings: none  Peri-prostatic block performed with 15mL 1% lidocaine .  A total of 12 core biopsies were taken in an extended  template. Care was taken to also obtain lateral peripheral zone biopsies. Ultrasound guidance was used to collect all of these biopsies.  The patient tolerated the procedure well.  Plan: Complete antibiotics as instructed. Patient informed to expect hematuria, hematochezia, and hematospermia. Call or return to ER for fevers >101F or difficulty urinating with clots. Will contact patient with results.

## 2019-12-31 ENCOUNTER — Emergency Department
Admission: EM | Admit: 2019-12-31 | Discharge: 2019-12-31 | Disposition: A | Discharge status: Home / Self Care | Payer: Medicare HMO | Attending: Emergency Medicine | Admitting: Emergency Medicine

## 2019-12-31 ENCOUNTER — Other Ambulatory Visit: Payer: Self-pay

## 2019-12-31 ENCOUNTER — Encounter: Payer: Self-pay | Admitting: Emergency Medicine

## 2019-12-31 DIAGNOSIS — Z5321 Procedure and treatment not carried out due to patient leaving prior to being seen by health care provider: Secondary | ICD-10-CM | POA: Insufficient documentation

## 2019-12-31 DIAGNOSIS — R55 Syncope and collapse: Secondary | ICD-10-CM | POA: Diagnosis not present

## 2019-12-31 LAB — CBC
HCT: 44.6 % (ref 39.0–52.0)
Hemoglobin: 15.2 g/dL (ref 13.0–17.0)
MCH: 31.9 pg (ref 26.0–34.0)
MCHC: 34.1 g/dL (ref 30.0–36.0)
MCV: 93.7 fL (ref 80.0–100.0)
Platelets: 247 10*3/uL (ref 150–400)
RBC: 4.76 MIL/uL (ref 4.22–5.81)
RDW: 12.3 % (ref 11.5–15.5)
WBC: 14.2 10*3/uL — ABNORMAL HIGH (ref 4.0–10.5)
nRBC: 0 % (ref 0.0–0.2)

## 2019-12-31 LAB — BASIC METABOLIC PANEL
Anion gap: 11 (ref 5–15)
BUN: 25 mg/dL — ABNORMAL HIGH (ref 8–23)
CO2: 27 mmol/L (ref 22–32)
Calcium: 9.6 mg/dL (ref 8.9–10.3)
Chloride: 102 mmol/L (ref 98–111)
Creatinine, Ser: 1.01 mg/dL (ref 0.61–1.24)
GFR calc Af Amer: >60 mL/min (ref >60)
GFR calc non Af Amer: >60 mL/min (ref >60)
Glucose, Bld: 111 mg/dL — ABNORMAL HIGH (ref 70–99)
Potassium: 4.3 mmol/L (ref 3.5–5.1)
Sodium: 140 mmol/L (ref 135–145)

## 2019-12-31 NOTE — ED Triage Notes (Signed)
First RN Note: pt presents to ED via [facility], per EMS pt was out on the tractor and became very sweaty, EMS reports pt went inside and had a syncopal episode, per EMS IVF given en route and patient arrives to ED A&O x4.    835 systolic

## 2020-01-03 ENCOUNTER — Telehealth: Payer: Self-pay | Admitting: Emergency Medicine

## 2020-01-03 NOTE — Telephone Encounter (Signed)
Called patient due to lwot to inquire about condition and follow up plans. Says he does still feel a bit weak, but otherwise okay.  He has not informed pcp.  I told him he should advise pcp of his symptoms and that labs are available.

## 2020-10-23 NOTE — Progress Notes (Signed)
 Procedure note  Procedure: Transrectal ultrasound and ultrasound-guided prostate needle biopsy with MRI fusion.  Indication: Billy Glass 80 y.o. with prostate cancer. MRI-US  fusion guided biopsy recommended for confirmatory biopsy for active surveillance. Additional detail in prior urology notes.   Lab Results  Component Value Date/Time   PSA 9.65 (H) 04/03/2020 09:53 AM   PSA 9.02 (H) 10/01/2019 11:16 AM   PSA 8.85 (H) 04/19/2019 03:09 PM   PSA 7.57 (H) 06/04/2018 10:36 AM   PSA 6.55 (H) 12/01/2017 02:58 PM   PSA 6.39 (H) 05/29/2017 11:21 AM   PSA 5.30 (H) 12/25/2016 10:45 AM   PSA 7.19 (H) 04/22/2016 02:15 PM   PSA 5.07 (H) 01/12/2016 08:56 AM   PSA 5.29 (H) 01/04/2016 11:15 AM   PSA 5.26 (H) 10/02/2015 03:57 PM   PSA 4.55 (H) 03/21/2015 08:51 AM   PSA 4.35 (H) 09/15/2014 09:36 AM   PSADIAG 4.2 (H) 03/31/2014 10:25 AM   PSASCRN 4.4 (H) 10/19/2013 10:15 AM   PSASCRN 3.9 06/10/2013 09:06 AM   PSASCRN 3.9 09/30/2012 02:12 PM    He previously underwent multi-parametric MRI of the prostate. The images and report are available in PACS. Both the expert radiologist and I have looked at this study carefully, including the T2 phase, the diffusion weighted imaging phase, and the dynamic contrast enhancement phase.   The mpMRI demonstrated 2 lesions:  Lesion 1: 3 in the right and anterior apical central/transitional zone Lesion 2: 3 in the left and anterior apical peripheral zone   Preparation: Enema and oral antibiotic prep adequate (oral Levaquin 500 mg and ceftriaxone 1 gram IM).  Resident Assistant: None  Time out was performed immediately prior to the procedure.   Procedure: The patient was placed in the left lateral decubitus knee-chest position. DRE revealed a 50 gram prostate with no nodule.   The electromagnetic field generator which is used to perform image guided biopsies was attached to the table and positioned over the hip. The ultrasound probe, specially fitted  with an EM needle tracking guide, was placed into the rectum. The TRUS probe was first used to guide placement of a bilateral periprostatic nerve block utilizing a total of 20 cc of 1% lidocaine  bilaterally at the apex and base.   The prostate was then evaluated with ultrasound:  Estimated volume: AP 53 mm x 31 mm x 51 mm, est vol 43.7 cc   Seminal vesicles: normal.  Hypoechoic lesions: none  During the procedure on an independent software fusion platform, the [employer] system with [employer], 3-D gland segmentation was done with the ultrasound fused to the 3-D segmentation of the MRI with a semi-automatic technique prior to targeted biopsy. The lesions that were suspicious for cancer on the preoperative MRI were called up as targets and overlaid on the real-time ultrasound image. Approximately 10 minutes was spent fusing the MRI with the transrectal ultrasound.  I then sampled with an 18-gauge spring loaded needle biopsy gun. I felt that sampling was successful.  Co-registration Quality:   Good  Targeting Quality:    Good  Cognitive Biopsy:    No   Next, 12 prostate biopsies were taken in the standard schematic fashion. The patient tolerated the procedure well without immediate complications.   Impression: 80 y.o. y.o.-year-old man with prostate cancer on active surveillance.  Core summary: Lesion 1: 2 targets Lesion 2: 2 targets   12 systematics  Plan:  1. F/u biopsy result. If malignant, patient will visit the multidisciplinary oncology clinic.  2. Prostate biopsy protocol antibiotics have been completed.  3. If negative, return to us  in 6 months for further monitoring. 4. Postbiopsy instructions given.

## 2021-09-18 ENCOUNTER — Encounter: Payer: Self-pay | Admitting: Ophthalmology

## 2021-09-24 NOTE — Discharge Instructions (Signed)

## 2021-09-26 ENCOUNTER — Ambulatory Visit: Payer: Medicare HMO | Admitting: Anesthesiology

## 2021-09-26 ENCOUNTER — Other Ambulatory Visit: Payer: Self-pay

## 2021-09-26 ENCOUNTER — Encounter: Payer: Self-pay | Admitting: Ophthalmology

## 2021-09-26 ENCOUNTER — Encounter
Admission: RE | Disposition: A | Discharge status: Home / Self Care | Payer: Self-pay | Source: Home / Self Care | Attending: Ophthalmology

## 2021-09-26 ENCOUNTER — Ambulatory Visit
Admission: RE | Admit: 2021-09-26 | Discharge: 2021-09-26 | Disposition: A | Discharge status: Home / Self Care | Payer: Medicare HMO | Attending: Ophthalmology | Admitting: Ophthalmology

## 2021-09-26 DIAGNOSIS — G4730 Sleep apnea, unspecified: Secondary | ICD-10-CM | POA: Insufficient documentation

## 2021-09-26 DIAGNOSIS — I2510 Atherosclerotic heart disease of native coronary artery without angina pectoris: Secondary | ICD-10-CM | POA: Diagnosis not present

## 2021-09-26 DIAGNOSIS — I5032 Chronic diastolic (congestive) heart failure: Secondary | ICD-10-CM | POA: Diagnosis not present

## 2021-09-26 DIAGNOSIS — K219 Gastro-esophageal reflux disease without esophagitis: Secondary | ICD-10-CM | POA: Diagnosis not present

## 2021-09-26 DIAGNOSIS — J45909 Unspecified asthma, uncomplicated: Secondary | ICD-10-CM | POA: Insufficient documentation

## 2021-09-26 DIAGNOSIS — H2512 Age-related nuclear cataract, left eye: Secondary | ICD-10-CM | POA: Diagnosis present

## 2021-09-26 DIAGNOSIS — Z87891 Personal history of nicotine dependence: Secondary | ICD-10-CM | POA: Diagnosis not present

## 2021-09-26 DIAGNOSIS — I4891 Unspecified atrial fibrillation: Secondary | ICD-10-CM | POA: Diagnosis not present

## 2021-09-26 DIAGNOSIS — I110 Hypertensive heart disease with heart failure: Secondary | ICD-10-CM | POA: Diagnosis not present

## 2021-09-26 HISTORY — DX: Chronic diastolic (congestive) heart failure: I50.32

## 2021-09-26 HISTORY — DX: Sick sinus syndrome: I49.5

## 2021-09-26 HISTORY — DX: Malignant neoplasm of prostate: C61

## 2021-09-26 HISTORY — PX: CATARACT EXTRACTION W/PHACO: SHX586

## 2021-09-26 HISTORY — DX: Unspecified atrial flutter: I48.92

## 2021-09-26 HISTORY — DX: Unspecified asthma, uncomplicated: J45.909

## 2021-09-26 HISTORY — DX: Essential (primary) hypertension: I10

## 2021-09-26 HISTORY — DX: Atherosclerotic heart disease of native coronary artery without angina pectoris: I25.10

## 2021-09-26 HISTORY — DX: Gastro-esophageal reflux disease without esophagitis: K21.9

## 2021-09-26 HISTORY — DX: Presence of dental prosthetic device (complete) (partial): Z97.2

## 2021-09-26 SURGERY — PHACOEMULSIFICATION, CATARACT, WITH IOL INSERTION
Anesthesia: Monitor Anesthesia Care | Site: Eye | Laterality: Left

## 2021-09-26 MED ORDER — ACETAMINOPHEN 160 MG/5ML PO SOLN: 325.0000 mg | ORAL | Status: DC | PRN

## 2021-09-26 MED ORDER — PHENYLEPHRINE HCL 10 % OP SOLN
1.0000 [drp] | OPHTHALMIC | Status: DC | PRN
  Administered 2021-09-26 (×3): 1 [drp] via OPHTHALMIC

## 2021-09-26 MED ORDER — TETRACAINE HCL 0.5 % OP SOLN
1.0000 [drp] | OPHTHALMIC | Status: DC | PRN
  Administered 2021-09-26 (×3): 1 [drp] via OPHTHALMIC

## 2021-09-26 MED ORDER — CYCLOPENTOLATE HCL 2 % OP SOLN
1.0000 [drp] | OPHTHALMIC | Status: DC | PRN
Start: 2021-09-26 — End: 2021-09-26
  Administered 2021-09-26 (×3): 1 [drp] via OPHTHALMIC

## 2021-09-26 MED ORDER — SIGHTPATH DOSE#1 BSS IO SOLN
INTRAOCULAR | Status: DC | PRN
Start: 2021-09-26 — End: 2021-09-26
  Administered 2021-09-26: 15 mL

## 2021-09-26 MED ORDER — CEFUROXIME OPHTHALMIC INJECTION 1 MG/0.1 ML
INJECTION | OPHTHALMIC | Status: DC | PRN
  Administered 2021-09-26: 0.1 mL via INTRACAMERAL

## 2021-09-26 MED ORDER — SIGHTPATH DOSE#1 BSS IO SOLN
INTRAOCULAR | Status: DC | PRN
  Administered 2021-09-26: 1 mL

## 2021-09-26 MED ORDER — MIDAZOLAM HCL 2 MG/2ML IJ SOLN
INTRAMUSCULAR | Status: DC | PRN
  Administered 2021-09-26: 1 mg via INTRAVENOUS

## 2021-09-26 MED ORDER — LACTATED RINGERS IV SOLN: INTRAVENOUS | Status: DC

## 2021-09-26 MED ORDER — FENTANYL CITRATE (PF) 100 MCG/2ML IJ SOLN
INTRAMUSCULAR | Status: DC | PRN
  Administered 2021-09-26: 50 ug via INTRAVENOUS

## 2021-09-26 MED ORDER — SIGHTPATH DOSE#1 NA HYALUR & NA CHOND-NA HYALUR IO KIT
PACK | INTRAOCULAR | Status: DC | PRN
  Administered 2021-09-26: 1 via OPHTHALMIC

## 2021-09-26 MED ORDER — SIGHTPATH DOSE#1 BSS IO SOLN
INTRAOCULAR | Status: DC | PRN
  Administered 2021-09-26: 92 mL via OPHTHALMIC

## 2021-09-26 MED ORDER — BRIMONIDINE TARTRATE-TIMOLOL 0.2-0.5 % OP SOLN
OPHTHALMIC | Status: DC | PRN
  Administered 2021-09-26: 1 [drp] via OPHTHALMIC

## 2021-09-26 MED ORDER — ACETAMINOPHEN 325 MG PO TABS: 325.0000 mg | ORAL_TABLET | ORAL | Status: DC | PRN

## 2021-09-26 SURGICAL SUPPLY — 21 items
CANNULA ANT/CHMB 27G (MISCELLANEOUS) IMPLANT
CANNULA ANT/CHMB 27GA (MISCELLANEOUS) IMPLANT
CATARACT SUITE SIGHTPATH (MISCELLANEOUS) ×2 IMPLANT
FEE CATARACT SUITE SIGHTPATH (MISCELLANEOUS) ×1 IMPLANT
GLOVE SRG 8 PF TXTR STRL LF DI (GLOVE) ×1 IMPLANT
GLOVE SURG ENC TEXT LTX SZ7.5 (GLOVE) ×2 IMPLANT
GLOVE SURG GAMMEX PI TX LF 7.5 (GLOVE) IMPLANT
GLOVE SURG UNDER POLY LF SZ8 (GLOVE) ×2
LENS IOL TECNIS EYHANCE 22.5 (Intraocular Lens) ×1 IMPLANT
NDL FILTER BLUNT 18X1 1/2 (NEEDLE) ×1 IMPLANT
NDL RETROBULBAR .5 NSTRL (NEEDLE) IMPLANT
NEEDLE FILTER BLUNT 18X 1/2SAF (NEEDLE) ×1
NEEDLE FILTER BLUNT 18X1 1/2 (NEEDLE) ×1 IMPLANT
PACK VIT ANT 23G (MISCELLANEOUS) IMPLANT
RING MALYGIN 7.0 (MISCELLANEOUS) ×1 IMPLANT
SUT ETHILON 10-0 CS-B-6CS-B-6 (SUTURE)
SUT VICRYL  9 0 (SUTURE)
SUT VICRYL 9 0 (SUTURE) IMPLANT
SUTURE EHLN 10-0 CS-B-6CS-B-6 (SUTURE) IMPLANT
SYR 3ML LL SCALE MARK (SYRINGE) ×2 IMPLANT
WATER STERILE IRR 250ML POUR (IV SOLUTION) ×2 IMPLANT

## 2021-09-26 NOTE — H&P (Signed)
[place]  ? ?Primary Care Physician:  Billy Morrow, MD ?Ophthalmologist: Dr. Leandrew Glass ? ?Pre-Procedure History & Physical: ?HPI:  Billy Glass is a 81 y.o. male here for ophthalmic surgery. ?  ?Past Medical History:  ?Diagnosis Date  ? Acute bronchitis   ? Asthma   ? Atrial flutter ([place])   ? Chronic diastolic heart failure ([place])   ? Chronic sinusitis   ? Coronary artery disease   ? Cough   ? GERD (gastroesophageal reflux disease)   ? Resolved  ? Hypertension   ? Resolved  ? Presence of permanent cardiac pacemaker 2011  ? Replaced 05/15/20  ? Prostate CA [facility])   ? Sinus node dysfunction (H[facility])   ? Wears dentures   ? full upper and lower  ? ? ?Past Surgical History:  ?Procedure Laterality Date  ? CARDIAC CATHETERIZATION  2008  ? INSERT / REPLACE / REMOVE PACEMAKER  2011  ? Replaced 05/15/20  ? PACEMAKER INSERTION    ? SINUS IRRIGATION    ? ? ?Prior to Admission medications   ?Medication Sig Start Date End Date Taking? Authorizing Provider  ?acetaminophen (TYLENOL) 500 MG tablet Take 1,000 mg by mouth every 6 (six) hours as needed.   Yes [provider]  ?cetirizine (ZYRTEC) 10 MG tablet Take 10 mg by mouth daily.   Yes [provider]  ?Cholecalciferol (VITAMIN D-3) 125 MCG (5000 UT) TABS Take by mouth daily.   Yes [provider]  ?citalopram (CELEXA) 20 MG tablet Take 20 mg by mouth daily.   Yes [provider]  ?gabapentin (NEURONTIN) 300 MG capsule Take 1 capsule by mouth 3 (three) times daily. 07/02/12  Yes [provider]  ?montelukast (SINGULAIR) 10 MG tablet Take 1 tablet by mouth daily. 11/28/12  Yes [provider]  ?Multiple Vitamin (MULTIVITAMIN WITH MINERALS) TABS tablet Take 1 tablet by mouth daily.   Yes [provider]  ?Nutritional Supplements (JUICE PLUS FIBRE PO) Take by mouth daily. 2 green (veggies) ?2 red (fruits)   Yes [provider]  ?rosuvastatin (CRESTOR) 20 MG tablet Take 20 mg by mouth  daily.   Yes [provider]  ?tamsulosin (FLOMAX) 0.4 MG CAPS Take 1 capsule by mouth daily. 06/30/12  Yes [provider]  ?vitamin B-12 (CYANOCOBALAMIN) 1000 MCG tablet Take 1,000 mcg by mouth daily.   Yes [provider]  ?vitamin C (ASCORBIC ACID) 500 MG tablet Take 500 mg by mouth daily.   Yes [provider]  ? ? ?Allergies as of 08/07/2021  ? (No Known Allergies)  ? ? ?Family History  ?Problem Relation Age of Onset  ? Hypertension Mother   ? ? ?Social History  ? ?Socioeconomic History  ? Marital status: Married  ?  Spouse name: Not on file  ? Number of children: Not on file  ? Years of education: Not on file  ? Highest education level: Not on file  ?Occupational History  ? Not on file  ?Tobacco Use  ? Smoking status: Former  ?  Packs/day: 1.00  ?  Years: 6.00  ?  Pack years: 6.00  ?  Types: Cigarettes  ?  Quit date: 05/13/1968  ?  Years since quitting: 53.4  ? Smokeless tobacco: Never  ?Vaping Use  ? Vaping Use: Never used  ?Substance and Sexual Activity  ? Alcohol use: No  ? Drug use: No  ? Sexual activity: Not on file  ?Other Topics Concern  ? Not on file  ?  Social History Narrative  ? Not on file  ? ?Social Determinants of Health  ? ?Financial Resource Strain: Not on file  ?Food Insecurity: Not on file  ?Transportation Needs: Not on file  ?Physical Activity: Not on file  ?Stress: Not on file  ?Social Connections: Not on file  ?Intimate Partner Violence: Not on file  ? ? ?Review of Systems: ?See HPI, otherwise negative ROS ? ?Physical Exam: ?BP 134/89   Pulse 63   Temp 98 ?F (36.7 ?C) (Temporal)   Resp 20   Ht '5\' 11"'$  (1.803 m)   Wt 78 kg   SpO2 96%   BMI 23.99 kg/m?  ?General:   Alert,  pleasant and cooperative in NAD ?Head:  Normocephalic and atraumatic. ?Lungs:  Clear to auscultation.    ?Heart:  Regular rate and rhythm.  ? ?Impression/Plan: ?Billy Glass is here for ophthalmic surgery. ? ?Risks, benefits, limitations, and alternatives regarding ophthalmic  surgery have been reviewed with the patient.  Questions have been answered.  All parties agreeable. ? ? Billy Koyanagi, MD  09/26/2021, 10:50 AM ? ? ?

## 2021-09-26 NOTE — Anesthesia Postprocedure Evaluation (Signed)
Anesthesia Post Note ? ?Patient: Billy Glass ? ?Procedure(s) Performed: CATARACT EXTRACTION PHACO AND INTRAOCULAR LENS PLACEMENT (IOC) LEFT (Left: Eye) ? ? ?  ?Patient location during evaluation: PACU ?Anesthesia Type: MAC ?Level of consciousness: awake and alert ?Pain management: pain level controlled ?Vital Signs Assessment: post-procedure vital signs reviewed and stable ?Respiratory status: spontaneous breathing, nonlabored ventilation, respiratory function stable and patient connected to nasal cannula oxygen ?Cardiovascular status: stable and blood pressure returned to baseline ?Postop Assessment: no apparent nausea or vomiting ?Anesthetic complications: no ? ? ?No notable events documented. ? ?Trecia Rogers ? ? ? ? ? ?

## 2021-09-26 NOTE — Anesthesia Preprocedure Evaluation (Addendum)
Anesthesia Evaluation  ?Patient identified by MRN, date of birth, ID band ?Patient awake ? ? ? ?Reviewed: ?Allergy & Precautions, H&P , NPO status , Patient's Chart, lab work & pertinent test results, reviewed documented beta blocker date and time  ? ?Airway ?Mallampati: II ? ?TM Distance: >3 FB ?Neck ROM: full ? ? ? Dental ? ?(+) Upper Dentures, Lower Dentures ?  ?Pulmonary ?asthma , sleep apnea , former smoker,  ?  ?Pulmonary exam normal ?breath sounds clear to auscultation ? ? ? ? ? ? Cardiovascular ?Exercise Tolerance: Good ?hypertension, + CAD  ?Normal cardiovascular examAtrial Fibrillation + pacemaker  ?Rhythm:regular Rate:Normal ? ? ?  ?Neuro/Psych ?negative neurological ROS ? negative psych ROS  ? GI/Hepatic ?Neg liver ROS, GERD  ,  ?Endo/Other  ?negative endocrine ROS ? Renal/GU ?negative Renal ROS  ?negative genitourinary ?  ?Musculoskeletal ? ? Abdominal ?  ?Peds ? Hematology ?negative hematology ROS ?(+)   ?Anesthesia Other Findings ? ? Reproductive/Obstetrics ?negative OB ROS ? ?  ? ? ? ? ? ? ? ? ? ? ? ? ? ?  ?  ? ? ? ? ? ? ? ? ?Anesthesia Physical ?Anesthesia Plan ? ?ASA: 2 ? ?Anesthesia Plan: MAC  ? ?Post-op Pain Management:   ? ?Induction:  ? ?PONV Risk Score and Plan:  ? ?Airway Management Planned:  ? ?Additional Equipment:  ? ?Intra-op Plan:  ? ?Post-operative Plan:  ? ?Informed Consent: I have reviewed the patients History and Physical, chart, labs and discussed the procedure including the risks, benefits and alternatives for the proposed anesthesia with the patient or authorized representative who has indicated his/her understanding and acceptance.  ? ? ? ?Dental Advisory Given ? ?Plan Discussed with: CRNA and Anesthesiologist ? ?Anesthesia Plan Comments:   ? ? ? ? ? ?Anesthesia Quick Evaluation ? ?

## 2021-09-26 NOTE — Op Note (Signed)
OPERATIVE NOTE ? ?Billy Glass ?[id number] ?09/26/2021 ? ?PREOPERATIVE DIAGNOSIS:   Nuclear sclerotic cataract left eye with miotic pupil      H25.12 ?  ?POSTOPERATIVE DIAGNOSIS:   Nuclear sclerotic cataract left eye with miotic pupil.   ?  ?PROCEDURE:  Phacoemulsification with posterior chamber intraocular lens implantation of the left eye which required pupil stretching with the Malyugin pupil expansion device ? Ultrasound time: Procedure(s) with comments: ?CATARACT EXTRACTION PHACO AND INTRAOCULAR LENS PLACEMENT (IOC) LEFT (Left) - 19.00 ?1:52.9 ? ?LENS:   ?Implant Name Type Inv. Item Serial No. Manufacturer Lot No. LRB No. Used Action  ?LENS IOL TECNIS EYHANCE 22.5 - [id number] Intraocular Lens LENS IOL TECNIS EYHANCE 22.5 [id number] [employer]  Left 1 Implanted  ?    ?  ?SURGEON:  Wyonia Hough, MD ?  ?ANESTHESIA: Topical with tetracaine drops and 2% Xylocaine jelly, augmented with 1% preservative-free intracameral lidocaine. ?  ?COMPLICATIONS:  None. ?  ?DESCRIPTION OF PROCEDURE:  The patient was identified in the holding room and transported to the operating room and placed in the supine position under the operating microscope.  The left eye was identified as the operative eye and it was prepped and draped in the usual sterile ophthalmic fashion. ?  ?A 1 millimeter clear-corneal paracentesis was made at the 1:30 position.  The anterior chamber was filled with Viscoat viscoelastic.  0.5 ml of preservative-free 1% lidocaine was injected into the anterior chamber.  A 2.4 millimeter keratome was used to make a near-clear corneal incision at the 10:30 position.  A Malyugin pupil expander was then placed through the main incision and into the anterior chamber of the eye.  The edge of the iris was secured on the lip of the pupil expander and it was released, thereby expanding the pupil to approximately 7 millimeters for completion of the cataract surgery.  Additional Viscoat was placed in the anterior  chamber.  A cystotome and capsulorrhexis forceps were used to make a curvilinear capsulorrhexis.   Balanced salt solution was used to hydrodissect and hydrodelineate the lens nucleus. ?  ?Phacoemulsification was used in stop and chop fashion to remove the lens, nucleus and epinucleus.  The remaining cortex was aspirated using the irrigation aspiration handpiece.  Additional Provisc was placed into the eye to distend the capsular bag for lens placement.  A lens was then injected into the capsular bag.  The pupil expanding ring was removed using a Kuglen hook and insertion device. The remaining viscoelastic was aspirated from the capsular bag and the anterior chamber.  The anterior chamber was filled with balanced salt solution to inflate to a physiologic pressure.  ? ?Wounds were hydrated with balanced salt solution.  The anterior chamber was inflated to a physiologic pressure with balanced salt solution.  No wound leaks were noted. Cefuroxime 0.1 ml of a '10mg'$ /ml solution was injected into the anterior chamber for a dose of 1 mg of intracameral antibiotic at the completion of the case. ?  Timolol and Brimonidine drops were applied to the eye.  The patient was taken to the recovery room in stable condition without complications of anesthesia or surgery. ? ?Billy Glass ?09/26/2021, 11:40 AM ? ?

## 2021-09-26 NOTE — Transfer of Care (Signed)
Immediate Anesthesia Transfer of Care Note ? ?Patient: Billy Glass ? ?Procedure(s) Performed: CATARACT EXTRACTION PHACO AND INTRAOCULAR LENS PLACEMENT (IOC) LEFT (Left: Eye) ? ?Patient Location: PACU ? ?Anesthesia Type: MAC ? ?Level of Consciousness: awake, alert  and patient cooperative ? ?Airway and Oxygen Therapy: Patient Spontanous Breathing and Patient connected to supplemental oxygen ? ?Post-op Assessment: Post-op Vital signs reviewed, Patient's Cardiovascular Status Stable, Respiratory Function Stable, Patent Airway and No signs of Nausea or vomiting ? ?Post-op Vital Signs: Reviewed and stable ? ?Complications: No notable events documented. ? ?

## 2021-09-27 ENCOUNTER — Other Ambulatory Visit: Payer: Self-pay

## 2021-09-27 ENCOUNTER — Encounter: Payer: Self-pay | Admitting: Ophthalmology

## 2021-10-04 NOTE — Discharge Instructions (Signed)

## 2021-10-10 ENCOUNTER — Encounter: Payer: Self-pay | Admitting: Ophthalmology

## 2021-10-10 ENCOUNTER — Other Ambulatory Visit: Payer: Self-pay

## 2021-10-10 ENCOUNTER — Ambulatory Visit: Payer: Medicare HMO | Admitting: Anesthesiology

## 2021-10-10 ENCOUNTER — Encounter
Admission: RE | Disposition: A | Discharge status: Home / Self Care | Payer: Self-pay | Source: Home / Self Care | Attending: Ophthalmology

## 2021-10-10 ENCOUNTER — Ambulatory Visit
Admission: RE | Admit: 2021-10-10 | Discharge: 2021-10-10 | Disposition: A | Discharge status: Home / Self Care | Payer: Medicare HMO | Attending: Ophthalmology | Admitting: Ophthalmology

## 2021-10-10 DIAGNOSIS — I10 Essential (primary) hypertension: Secondary | ICD-10-CM | POA: Insufficient documentation

## 2021-10-10 DIAGNOSIS — H2511 Age-related nuclear cataract, right eye: Secondary | ICD-10-CM | POA: Insufficient documentation

## 2021-10-10 DIAGNOSIS — I2510 Atherosclerotic heart disease of native coronary artery without angina pectoris: Secondary | ICD-10-CM | POA: Insufficient documentation

## 2021-10-10 DIAGNOSIS — Z87891 Personal history of nicotine dependence: Secondary | ICD-10-CM | POA: Diagnosis not present

## 2021-10-10 DIAGNOSIS — K219 Gastro-esophageal reflux disease without esophagitis: Secondary | ICD-10-CM | POA: Diagnosis not present

## 2021-10-10 DIAGNOSIS — Z950 Presence of cardiac pacemaker: Secondary | ICD-10-CM | POA: Insufficient documentation

## 2021-10-10 HISTORY — PX: CATARACT EXTRACTION W/PHACO: SHX586

## 2021-10-10 SURGERY — PHACOEMULSIFICATION, CATARACT, WITH IOL INSERTION
Anesthesia: Monitor Anesthesia Care | Site: Eye | Laterality: Right

## 2021-10-10 MED ORDER — BRIMONIDINE TARTRATE-TIMOLOL 0.2-0.5 % OP SOLN
OPHTHALMIC | Status: DC | PRN
  Administered 2021-10-10: 1 [drp] via OPHTHALMIC

## 2021-10-10 MED ORDER — MIDAZOLAM HCL 2 MG/2ML IJ SOLN
INTRAMUSCULAR | Status: DC | PRN
  Administered 2021-10-10: 1 mg via INTRAVENOUS

## 2021-10-10 MED ORDER — ARMC OPHTHALMIC DILATING DROPS
1.0000 "application " | OPHTHALMIC | Status: DC | PRN
  Administered 2021-10-10 (×3): 1 via OPHTHALMIC

## 2021-10-10 MED ORDER — CEFUROXIME OPHTHALMIC INJECTION 1 MG/0.1 ML
INJECTION | OPHTHALMIC | Status: DC | PRN
  Administered 2021-10-10: 1 mg via OPHTHALMIC

## 2021-10-10 MED ORDER — SIGHTPATH DOSE#1 BSS IO SOLN
INTRAOCULAR | Status: DC | PRN
  Administered 2021-10-10: 75 mL via OPHTHALMIC

## 2021-10-10 MED ORDER — FENTANYL CITRATE (PF) 100 MCG/2ML IJ SOLN
INTRAMUSCULAR | Status: DC | PRN
Start: 2021-10-10 — End: 2021-10-10
  Administered 2021-10-10: 50 ug via INTRAVENOUS

## 2021-10-10 MED ORDER — ACETAMINOPHEN 325 MG PO TABS: 325.0000 mg | ORAL_TABLET | ORAL | Status: DC | PRN

## 2021-10-10 MED ORDER — ONDANSETRON HCL 4 MG/2ML IJ SOLN: 4.0000 mg | Freq: Once | INTRAMUSCULAR | Status: DC | PRN

## 2021-10-10 MED ORDER — SIGHTPATH DOSE#1 BSS IO SOLN
INTRAOCULAR | Status: DC | PRN
  Administered 2021-10-10: 1 mL via INTRAMUSCULAR

## 2021-10-10 MED ORDER — TETRACAINE HCL 0.5 % OP SOLN
1.0000 [drp] | OPHTHALMIC | Status: DC | PRN
  Administered 2021-10-10 (×3): 1 [drp] via OPHTHALMIC

## 2021-10-10 MED ORDER — SIGHTPATH DOSE#1 BSS IO SOLN
INTRAOCULAR | Status: DC | PRN
  Administered 2021-10-10: 15 mL

## 2021-10-10 MED ORDER — SIGHTPATH DOSE#1 NA HYALUR & NA CHOND-NA HYALUR IO KIT
PACK | INTRAOCULAR | Status: DC | PRN
  Administered 2021-10-10: 1 via OPHTHALMIC

## 2021-10-10 MED ORDER — ACETAMINOPHEN 160 MG/5ML PO SOLN: 325.0000 mg | ORAL | Status: DC | PRN

## 2021-10-10 SURGICAL SUPPLY — 12 items
CATARACT SUITE SIGHTPATH (MISCELLANEOUS) ×2 IMPLANT
FEE CATARACT SUITE SIGHTPATH (MISCELLANEOUS) ×1 IMPLANT
GLOVE SRG 8 PF TXTR STRL LF DI (GLOVE) ×1 IMPLANT
GLOVE SURG ENC TEXT LTX SZ7.5 (GLOVE) ×2 IMPLANT
GLOVE SURG UNDER POLY LF SZ8 (GLOVE) ×2
LENS IOL TECNIS EYHANCE 22.0 (Intraocular Lens) ×1 IMPLANT
NDL FILTER BLUNT 18X1 1/2 (NEEDLE) ×1 IMPLANT
NEEDLE FILTER BLUNT 18X 1/2SAF (NEEDLE) ×1
NEEDLE FILTER BLUNT 18X1 1/2 (NEEDLE) ×1 IMPLANT
RING MALYGIN 7.0 (MISCELLANEOUS) ×1 IMPLANT
SYR 3ML LL SCALE MARK (SYRINGE) ×2 IMPLANT
WATER STERILE IRR 250ML POUR (IV SOLUTION) ×2 IMPLANT

## 2021-10-10 NOTE — Transfer of Care (Signed)
Immediate Anesthesia Transfer of Care Note  Patient: Billy Glass  Procedure(s) Performed: CATARACT EXTRACTION PHACO AND INTRAOCULAR LENS PLACEMENT (IOC) RIGHT (Right: Eye)  Patient Location: PACU  Anesthesia Type: MAC  Level of Consciousness: awake, alert  and patient cooperative  Airway and Oxygen Therapy: Patient Spontanous Breathing and Patient connected to supplemental oxygen  Post-op Assessment: Post-op Vital signs reviewed, Patient's Cardiovascular Status Stable, Respiratory Function Stable, Patent Airway and No signs of Nausea or vomiting  Post-op Vital Signs: Reviewed and stable  Complications: No notable events documented.

## 2021-10-10 NOTE — Anesthesia Postprocedure Evaluation (Signed)
Anesthesia Post Note  Patient: Billy Glass  Procedure(s) Performed: CATARACT EXTRACTION PHACO AND INTRAOCULAR LENS PLACEMENT (IOC) RIGHT (Right: Eye)     Patient location during evaluation: PACU Anesthesia Type: MAC Level of consciousness: awake Pain management: pain level controlled Vital Signs Assessment: post-procedure vital signs reviewed and stable Respiratory status: respiratory function stable Cardiovascular status: stable Postop Assessment: no apparent nausea or vomiting Anesthetic complications: no   No notable events documented.  Veda Canning

## 2021-10-10 NOTE — Op Note (Signed)
  OPERATIVE NOTE  Billy Glass [id number] 10/10/2021   PREOPERATIVE DIAGNOSIS:    Nuclear Sclerotic Cataract Right eye with miotic pupil.        H25.11  POSTOPERATIVE DIAGNOSIS: Nuclear Sclerotic Cataract Right eye with miotic pupil.          PROCEDURE:  Phacoemusification with posterior chamber intraocular lens placement of the right eye which required pupil stretching with the Malyugin pupil expansion device. Ultrasound time: Procedure(s) with comments: CATARACT EXTRACTION PHACO AND INTRAOCULAR LENS PLACEMENT (IOC) RIGHT (Right) - 15.74 01:33.1  LENS:   Implant Name Type Inv. Item Serial No. Manufacturer Lot No. LRB No. Used Action  LENS IOL TECNIS EYHANCE 22.0 - [id number] Intraocular Lens LENS IOL TECNIS EYHANCE 22.0 [id number] [employer]  Right 1 Implanted       SURGEON:  Wyonia Hough, MD   ANESTHESIA:  Topical with tetracaine drops and 2% Xylocaine jelly, augmented with 1% preservative-free intracameral lidocaine.   COMPLICATIONS:  None.   DESCRIPTION OF PROCEDURE:  The patient was identified in the holding room and transported to the operating room and placed in the supine position under the operating microscope. Theright eye was identified as the operative eye and it was prepped and draped in the usual sterile ophthalmic fashion.   A 1 millimeter clear-corneal paracentesis was made at the 12:00 position.  0.5 ml of preservative-free 1% lidocaine was injected into the anterior chamber. The anterior chamber was filled with Viscoat viscoelastic.  A 2.4 millimeter keratome was used to make a near-clear corneal incision at the 9:00 position. A Malyugin pupil expander was then placed through the main incision and into the anterior chamber of the eye.  The edge of the iris was secured on the lip of the pupil expander and it was released, thereby expanding the pupil to approximately 7 millimeters for completion of the cataract surgery.  Additional Viscoat was placed in the  anterior chamber.  A cystotome and capsulorrhexis forceps were used to make a curvilinear capsulorrhexis.   Balanced salt solution was used to hydrodissect and hydrodelineate the lens nucleus.   Phacoemulsification was used in stop and chop fashion to remove the lens, nucleus and epinucleus.  The remaining cortex was aspirated using the irrigation aspiration handpiece.  Additional Provisc was placed into the eye to distend the capsular bag for lens placement.  A lens was then injected into the capsular bag.  The pupil expanding ring was removed using a Kuglen hook and insertion device. The remaining viscoelastic was aspirated from the capsular bag and the anterior chamber.  The anterior chamber was filled with balanced salt solution to inflate to a physiologic pressure.  Wounds were hydrated with balanced salt solution.  The anterior chamber was inflated to a physiologic pressure with balanced salt solution.  No wound leaks were noted.Cefuroxime 0.1 ml of a '10mg'$ /ml solution was injected into the anterior chamber for a dose of 1 mg of intracameral antibiotic at the completion of the case. Timolol and Brimonidine drops were applied to the eye.  The patient was taken to the recovery room in stable condition without complications of anesthesia or surgery.  [name] 10/10/2021, 9:32 AM

## 2021-10-10 NOTE — Anesthesia Preprocedure Evaluation (Signed)
Anesthesia Evaluation  Patient identified by MRN, date of birth, ID band Patient awake    Reviewed: Allergy & Precautions, H&P , NPO status   Airway Mallampati: II  TM Distance: >3 FB Neck ROM: full    Dental  (+) Upper Dentures, Lower Dentures   Pulmonary asthma , sleep apnea , former smoker,    Pulmonary exam normal breath sounds clear to auscultation       Cardiovascular Exercise Tolerance: Good hypertension, + CAD  + pacemaker  Rhythm:regular Rate:Normal     Neuro/Psych    GI/Hepatic GERD  ,  Endo/Other    Renal/GU      Musculoskeletal   Abdominal   Peds  Hematology   Anesthesia Other Findings   Reproductive/Obstetrics                             Anesthesia Physical  Anesthesia Plan  ASA: 3  Anesthesia Plan: MAC   Post-op Pain Management: Minimal or no pain anticipated   Induction:   PONV Risk Score and Plan: 1 and Treatment may vary due to age or medical condition  Airway Management Planned: Nasal Cannula and Natural Airway  Additional Equipment:   Intra-op Plan:   Post-operative Plan:   Informed Consent: I have reviewed the patients History and Physical, chart, labs and discussed the procedure including the risks, benefits and alternatives for the proposed anesthesia with the patient or authorized representative who has indicated his/her understanding and acceptance.     Dental Advisory Given  Plan Discussed with: CRNA  Anesthesia Plan Comments:         Anesthesia Quick Evaluation

## 2021-10-10 NOTE — H&P (Signed)
[name]   Primary Care Physician:  Tomasita Morrow, MD Ophthalmologist: Dr. Leandrew Koyanagi  Pre-Procedure History & Physical: HPI:  Billy Glass is a 81 y.o. male here for ophthalmic surgery.   Past Medical History:  Diagnosis Date   Acute bronchitis    Asthma    Atrial flutter (HCC)    Chronic diastolic heart failure (HCC)    Chronic sinusitis    Coronary artery disease    Cough    GERD (gastroesophageal reflux disease)    Resolved   Hypertension    Resolved   Presence of permanent cardiac pacemaker 2011   Replaced 05/15/20   Prostate CA ([place])    Sinus node dysfunction ([place])    Wears dentures    full upper and lower    Past Surgical History:  Procedure Laterality Date   CARDIAC CATHETERIZATION  2008   CATARACT EXTRACTION W/PHACO Left 09/26/2021   Procedure: CATARACT EXTRACTION PHACO AND INTRAOCULAR LENS PLACEMENT ([place]) LEFT;  Surgeon: Leandrew Koyanagi, MD;  Location: [facility];  Service: Ophthalmology;  Laterality: Left;  19.00 1:52.9   INSERT / REPLACE / REMOVE PACEMAKER  2011   Replaced 05/15/20   PACEMAKER INSERTION     SINUS IRRIGATION      Prior to Admission medications   Medication Sig Start Date End Date Taking? Authorizing Provider  acetaminophen (TYLENOL) 500 MG tablet Take 1,000 mg by mouth every 6 (six) hours as needed.   Yes [provider]  cetirizine (ZYRTEC) 10 MG tablet Take 10 mg by mouth daily.   Yes [provider]  Cholecalciferol (VITAMIN D-3) 125 MCG (5000 UT) TABS Take by mouth daily.   Yes [provider]  citalopram (CELEXA) 20 MG tablet Take 20 mg by mouth daily.   Yes [provider]  gabapentin (NEURONTIN) 300 MG capsule Take 1 capsule by mouth 3 (three) times daily. 07/02/12  Yes [provider]  montelukast (SINGULAIR) 10 MG tablet Take 1 tablet by mouth daily. 11/28/12  Yes [provider]  Multiple Vitamin (MULTIVITAMIN WITH MINERALS) TABS tablet  Take 1 tablet by mouth daily.   Yes [provider]  Nutritional Supplements (JUICE PLUS FIBRE PO) Take by mouth daily. 2 green (veggies) 2 red (fruits)   Yes [provider]  rosuvastatin (CRESTOR) 20 MG tablet Take 20 mg by mouth daily.   Yes [provider]  tamsulosin (FLOMAX) 0.4 MG CAPS Take 1 capsule by mouth daily. 06/30/12  Yes [provider]  vitamin B-12 (CYANOCOBALAMIN) 1000 MCG tablet Take 1,000 mcg by mouth daily.   Yes [provider]  vitamin C (ASCORBIC ACID) 500 MG tablet Take 500 mg by mouth daily.   Yes [provider]    Allergies as of 08/07/2021   (No Known Allergies)    Family History  Problem Relation Age of Onset   Hypertension Mother     Social History   Socioeconomic History   Marital status: Married    Spouse name: Not on file   Number of children: Not on file   Years of education: Not on file   Highest education level: Not on file  Occupational History   Not on file  Tobacco Use   Smoking status: Former    Packs/day: 1.00    Years: 6.00    Pack years: 6.00    Types: Cigarettes    Quit date: 05/13/1968    Years since quitting: 53.4   Smokeless tobacco: Never  Vaping  Use   Vaping Use: Never used  Substance and Sexual Activity   Alcohol use: No   Drug use: No   Sexual activity: Not on file  Other Topics Concern   Not on file  Social History Narrative   Not on file   Social Determinants of Health   Financial Resource Strain: Not on file  Food Insecurity: Not on file  Transportation Needs: Not on file  Physical Activity: Not on file  Stress: Not on file  Social Connections: Not on file  Intimate Partner Violence: Not on file    Review of Systems: See HPI, otherwise negative ROS  Physical Exam: There were no vitals taken for this visit. General:   Alert,  pleasant and cooperative in NAD Head:  Normocephalic and atraumatic. Lungs:  Clear to auscultation.    Heart:  Regular  rate and rhythm.   Impression/Plan: Billy Glass is here for ophthalmic surgery.  Risks, benefits, limitations, and alternatives regarding ophthalmic surgery have been reviewed with the patient.  Questions have been answered.  All parties agreeable.   Leandrew Koyanagi, MD  10/10/2021, 8:09 AM

## 2023-08-04 NOTE — Progress Notes (Signed)
 Procedure note  Procedure: Transrectal ultrasound and ultrasound-guided prostate needle biopsy with MRI fusion.  Indication: Billy Glass 83 y.o. with prostate cancer. MRI-US  fusion guided biopsy recommended for confirmatory biopsy for active surveillance. Additional detail in prior urology notes.   Lab Results  Component Value Date/Time   PSA 8.38 (H) 05/17/2021 11:17 AM   PSA 9.65 (H) 04/03/2020 09:53 AM   PSA 9.02 (H) 10/01/2019 11:16 AM   PSA 8.85 (H) 04/19/2019 03:09 PM   PSA 7.57 (H) 06/04/2018 10:36 AM   PSA 6.55 (H) 12/01/2017 02:58 PM   PSA 6.39 (H) 05/29/2017 11:21 AM   PSA 5.30 (H) 12/25/2016 10:45 AM   PSA 7.19 (H) 04/22/2016 02:15 PM   PSA 5.07 (H) 01/12/2016 08:56 AM   PSA 5.29 (H) 01/04/2016 11:15 AM   PSA 5.26 (H) 10/02/2015 03:57 PM   PSA 4.55 (H) 03/21/2015 08:51 AM   PSA 4.35 (H) 09/15/2014 09:36 AM   PSADIAG 4.2 (H) 03/31/2014 10:25 AM   PSASCRN 4.4 (H) 10/19/2013 10:15 AM   PSASCRN 3.9 06/10/2013 09:06 AM   PSASCRN 3.9 09/30/2012 02:12 PM    He previously underwent multi-parametric MRI of the prostate. The images and report are available in PACS. Both the expert radiologist and I have looked at this study carefully, including the T2 phase, the diffusion weighted imaging phase, and the dynamic contrast enhancement phase.   The mpMRI demonstrated 2 lesions:  Lesion 1: 3 in the right and anterior apical central/transitional zone Lesion 2: 3 in the left and anterior apical central/transitional zone  Preparation: Enema and antibiotic prep adequate (oral Levofloxacin 500 mg  and ceftriaxone 1 gram IM).  Resident Assistant: None  Time out was performed immediately prior to the procedure.   Procedure: The patient was placed in the left lateral decubitus position. DRE revealed a 40 gram prostate with no nodule.   The electromagnetic field generator which is used to perform image guided biopsies was attached to the table and positioned over the hip. The  ultrasound probe, specially fitted with an EM needle tracking guide, was placed into the rectum. The TRUS probe was first used to guide placement of a bilateral periprostatic nerve block utilizing a total of 20 cc of 1% lidocaine  bilaterally at the apex and base. During the procedure on an independent software fusion platform, the [employer] system with [employer], 3-D gland segmentation was done with the ultrasound fused to the 3-D segmentation of the MRI with a semi-automatic technique prior to targeted biopsy. The lesions that were suspicious for cancer on the preoperative MRI were called up as targets and overlaid on the real-time ultrasound image. Approximately 10 minutes was spent fusing the MRI with the transrectal ultrasound.  Targeted Cores of the MRI ROI was obtained with an 18-gauge spring loaded needle biopsy gun. I felt that sampling was successful.  49.7 The prostate was then evaluated with ultrasound:  Estimated volume: AP 57.6 mm x 36.6 mm x 45.3 mm, est vol 49.7 cc   Seminal vesicles: normal.  Hypoechoic lesions: none  The imaging is on file and stored in a permanent location.  Co-registration Quality:   Excellent  Targeting Quality:    Very Good  Cognitive Biopsy:    No   How intense was the pain at its worst: 2: mild  How intense was the average pain: 2: mild    Next, 12 prostate biopsies were taken in the standard schematic fashion. The patient tolerated the procedure well without immediate complications.  Impression: 83 y.o. y.o.-year-old man with prostate cancer on active surveillance.  Core summary: Lesion 1: 3 targets Lesion 2: 2 targets   12 systematics  Plan:  1. F/u biopsy result. If malignant, patient will visit the multidisciplinary oncology clinic.  2. Prostate biopsy protocol antibiotics have been completed.  3. If negative, return to us  in 6 months for further monitoring. 4. Postbiopsy instructions given.

## 2023-08-14 NOTE — Progress Notes (Signed)
 The patient reports they are physically located in [place]  and is currently: at home. I conducted a audio/video visit. I spent  42m 40s on the video call with the patient. I spent an additional 10 minutes on pre- and post-visit activities on the date of service .         Reason for visit: Prostate biopsy results   HPI:  Mr. Billy Glass is a 83 y.o. year old gentleman with a history of prostate cancer on active surveillance. Here for prostate biopsy results review  [facility] provider: Uro onc provider list: Dr Alm Louder TRUS: MpMRI: -PI-RADS 3 lesions within the right apical anterior transition zone and left basilar anterior peripheral zone. These lesions are felt to correspond to the lesions noted on prior exam but are less conspicuous compared to prior examination. These regions are unchanged from the 2022 study.  Size: 56 Size:  PSA:  Lab Results  Component Value Date/Time   PSA 8.38 (H) 05/17/2021 11:17 AM   PSA 9.65 (H) 04/03/2020 09:53 AM   PSA 9.02 (H) 10/01/2019 11:16 AM   PSA 8.85 (H) 04/19/2019 03:09 PM   PSA 7.57 (H) 06/04/2018 10:36 AM   PSA 6.55 (H) 12/01/2017 02:58 PM   PSA 6.39 (H) 05/29/2017 11:21 AM   PSA 5.30 (H) 12/25/2016 10:45 AM   PSA 7.19 (H) 04/22/2016 02:15 PM   PSA 5.07 (H) 01/12/2016 08:56 AM   PSA 5.29 (H) 01/04/2016 11:15 AM   PSA 5.26 (H) 10/02/2015 03:57 PM   PSA 4.55 (H) 03/21/2015 08:51 AM   PSA 4.35 (H) 09/15/2014 09:36 AM   PSADIAG 4.2 (H) 03/31/2014 10:25 AM   PSASCRN 4.4 (H) 10/19/2013 10:15 AM   PSASCRN 3.9 06/10/2013 09:06 AM   PSASCRN 3.9 09/30/2012 02:12 PM       Path report: Diagnosis  Date Value Ref Range Status  08/04/2023   Final   A:  Prostate, right apex, needle core biopsy - Prostatic adenocarcinoma, 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   H:  Prostate, target 2, needle core biopsy - Benign prostate tissue  This electronic signature is attestation that the pathologist personally reviewed the submitted material(s) and the final diagnosis reflects that evaluation.    Diagnosis Comment  Date Value Ref Range Status  10/23/2020   Final   Part C: A PIN4 cocktail stain demonstrates intact staining for basal cell layer with p63 and HMWCK and positive staining for racemase (p504S) in the focus of HGPIN. In the focus of atypical glands suspicious for carcinoma, there appears to be absent staining for basal cell layer with p63 and HMWCK; however, the glands are negative for racemase precluding definitive diagnosis of carcinoma in this part.      Allergies  Allergen Reactions  . Lisinopril Other (See Comments)  . Losartan Other (See Comments)    Prior to Admission medications   Medication Sig Start Date End Date Taking? Authorizing Provider  acetaminophen  (TYLENOL ) 500 MG tablet Take by mouth every four (4) hours as needed for pain.    Provider, Historical, Glass  ascorbic acid (VITAMIN C ORAL)     Provider, Historical, Glass  cetirizine (ZYRTEC) 10 MG tablet Take 1 tablet (10 mg total) by mouth daily.    Provider, Historical, Glass  cholecalciferol, vitamin D3, (VITAMIN D3 ORAL)     Provider, Historical, Glass  ciprofloxacin HCl (CIPRO) 500 MG tablet Take 1 tablet (500 mg total) by mouth two (2) times a day. Start taking antibiotics the day prior to your prostate  biopsy Patient not taking: Reported on 08/04/2023 05/05/23   Billy Glass  citalopram (CELEXA) 20 MG tablet TAKE 1 TABLET BY MOUTH EVERY DAY 07/29/23   Billy Glass  cyanocobalamin, vitamin B-12, (VITAMIN B-12 ORAL)     Provider, Historical, Glass  DYMISTA 137-50 mcg/spray Spry 1 spray into each nostril two (2) times a day. 01/31/14   Provider, Historical, Glass  EPINEPHrine  (EPIPEN ) 0.3 mg/0.3 mL injection As needed 07/25/19   Provider, Historical, Glass  FASENRA 30 mg/mL Syrg subcutaneous injection every 8 weeks.  07/19/19   Provider, Historical, Glass  gabapentin (NEURONTIN) 300 MG capsule Take 1 capsule (300 mg total) by mouth Three (3) times a day. 05/26/23 11/22/23  Billy Glass  latanoprost (XALATAN) 0.005 % ophthalmic solution Administer 0.005 drops to both eyes in the morning. 03/22/21   Provider, Historical, Glass  miscellaneous medical supply Misc Dx - [facility]   Rx  -  Mask for cpap machinne 04/25/16   [name], Glass  montelukast (SINGULAIR) 10 mg tablet TAKE 1 TABLET (10 MG TOTAL) BY MOUTH NIGHTLY 07/29/23   Billy Glass  multivitamin (TAB-A-VITE/THERAGRAN) per tablet Take 1 tablet by mouth daily.    Provider, Historical, Glass  rosuvastatin (CRESTOR) 20 MG tablet Take 1 tablet (20 mg total) by mouth nightly. 08/19/22 08/19/23  Billy Glass  SYMBICORT 160-4.5 mcg/actuation inhaler Inhale 2 puffs at bedtime as needed. 03/02/14   Provider, Historical, Glass  tamsulosin (FLOMAX) 0.4 mg capsule Take 1 capsule (0.4 mg total) by mouth daily. 08/19/22   Billy Glass  triamcinolone (NASACORT) 55 mcg nasal inhaler Inhale 1 spray as needed.    Provider, Historical, Glass  VENTOLIN HFA 90 mcg/actuation inhaler daily as needed. 08/31/14   Provider, Historical, Glass    Past Medical History:  Diagnosis Date  . Arthritis   . Asthma   . Cancer    prostate  . Coronary artery disease   . Elevated prostate specific antigen (PSA)   . GERD  (gastroesophageal reflux disease)   . Gross hematuria   . Hyperlipidemia   . Hypertension   . Hypertrophy of prostate with urinary obstruction and other lower urinary tract symptoms (LUTS)   . Neoplasm of uncertain behavior of prostate   . Sleep apnea     ROS: Review of Systems - Genito-Urinary ROS: no dysuria, trouble voiding, or hematuria   Assessment:   Spoke with Missy LITTIE Rodriguez  re: prostate biopsy results. We discussed pathology report including Gleason score and volume, risk stratification, and follow-up plan. He will be contacted by our scheduling team for further details. If he has not been contacted by our team in the next 5 business  days he will contact our clinic.   He reports he is still seeing blood in the urine from time to time  Flomax he continues to take daily which he thinks helps pretty well Since the biopsy he has noticed some weaker stream but overall feels that he is emptying to completion  Based on Dr. Kyra assessment he think could reduce biopsy need in the future and offered annual visit with PSA but Mr. Duchesne prefers 72mo follow up for now   He does not require imaging based on his risk stratification He Has had mpMRI of the prostate His risk stratification is < intermediate risk and does not require imaging                   Plan:  72mo visit with Dr. Vicci  For post bx weaker stream could consider increasing flomax to 2 pills daily in the short term but did review side effect risks, he going to think about this     [email or web address]

## 2024-04-12 NOTE — Progress Notes (Signed)
 Cardiology New Patient Visit  PRIMARY CARE PROVIDER:   Fernande Ophelia Marinell DOUGLAS, MD [address] [facility] [place] [address]  [name], [place] 04/12/2024 DOB: 14-Jun-1940 Age: 83 y.o. [facility]    Chief Complaint  Patient presents with  . Congestive Heart Failure    History of Present Illness  Billy Glass is a 83 y.o. male here for evaluation of: h/o SSS, s/p Medtronic dual chamber PPM first implanted in 06/2009 (gen change w/ Dr Lorean at [facility] 05/2020). Remote monitoring through [facility]. Last interrogation May 2025 showed 56 min of AT/AF. Has not been anticoagulated, he says. Does take ASA every 3rd day. This is because his blood counts were dropping, he says. He cannot recall why ASA was started in the first place, though he states he has been on it for about ten years. No prior h/o CAD, MI, HF that he knows of. No h/o PCI or CABG. Stays fairly active overall, he says. No CP or pressure. No syncope. No SOB at rest or excessive DOE.    Current Medications and Allergies   Allergies  Allergen Reactions  . Lisinopril Unknown  . Losartan Unknown  . Morphine Other (See Comments)    Dizziness    Current Outpatient Medications  Medication Sig Dispense Refill  . acetaminophen  (TYLENOL ) 500 MG tablet Take by mouth    . ascorbic acid, vitamin C, (VITAMIN C) 500 MG tablet Take 500 mg by mouth once daily    . benralizumab (FASENRA SUBQ) Inject subcutaneously every 8 (eight) weeks    . budesonide-formoteroL (SYMBICORT) 80-4.5 mcg/actuation inhaler Inhale 2 inhalations into the lungs 2 (two) times daily    . cetirizine (ZYRTEC) 10 MG tablet Take 10 mg by mouth once daily    . cholecalciferol, vitamin D3, (VITAMIN D3 ORAL) Take 5,000 Units by mouth Tues and thursdays    . citalopram (CELEXA) 20 MG tablet Take 20 mg by mouth once daily    . gabapentin (NEURONTIN) 300 MG capsule TAKE 1 CAPSULE BY MOUTH THREE TIMES A DAY 90 capsule 3  . montelukast (SINGULAIR) 10 mg tablet  Take 10 mg by mouth nightly.    . multivitamin tablet Take 1 tablet by mouth once daily Mens one a day    . rosuvastatin (CRESTOR) 20 MG tablet Take 20 mg by mouth once daily    . tamsulosin (FLOMAX) 0.4 mg capsule Take 0.4 mg by mouth once daily. Take 30 minutes after same meal each day.    . triamcinolone (NASACORT AQ) 55 mcg nasal spray Place 2 sprays into both nostrils once daily    . UNABLE TO FIND Med Name: juice plus green    . UNABLE TO FIND Med Name: juice plus fruits    . omeprazole (PRILOSEC) 20 MG DR capsule TAKE TWO CAPSULES BY MOUTH EACH DAY AS DIRECTED 60 capsule 1   No current facility-administered medications for this visit.    Additional Past Medical, Social, and Family History:  ADDITIONAL PROBLEM LIST: Problem List  Date Reviewed: 04/12/2024        ICD-10-CM Priority Class Noted - Resolved Diagnosed   Chronic asthma, mild persistent, uncomplicated (HHS-HCC) J45.30   03/19/2024 - Present    Chronic neck pain M54.2, G89.29   03/19/2024 - Present    Prostate cancer (CMS/HHS-HCC) C61   Unknown - Present    Overview Signed 03/18/2024 10:49 PM by Fernande Ophelia Marinell III, MD  by biopsy 3/25      Anxiety, mild F41.9  03/18/2024 - Present    Chronic diastolic heart failure (CMS/HHS-HCC) I50.32   09/30/2019 - Present    Benign essential hypertension I10   12/14/2014 - Present    Hyperlipidemia, mixed E78.2   12/14/2014 - Present    Sleep apnea G47.30   Unknown - Present    Overview Signed 03/19/2024  7:58 AM by Fernande Ophelia Drown III, MD  On CPAP      History of atrial flutter Z86.79   Unknown - Present    Pacemaker Z95.0   Unknown - Present    Overview Signed 08/30/2013 10:14 AM by Almarie Kubas, CMA  WITH SYNCOPAL EPISODE       Coronary artery disease I25.10   08/30/2013 - Present    Overview Signed 08/30/2013 10:13 AM by Almarie Kubas, CMA  BY CATHETERIZATION 11/2006 WITH A 30% LAD, 25% CIRCUMFLEX, 85% AND 75%  PROXIMAL MID RCA LESION      Gastroesophageal reflux disease K21.9    07/07/2012 - Present     ADDITIONAL MEDICAL HISTORY: Past Medical History:  Diagnosis Date  . AR (allergic rhinitis)   . Chronic asthma, mild persistent, uncomplicated (HHS-HCC) 03/19/2024  . Coronary artery disease   . History of atrial flutter   . Hyperlipidemia   . Hypertension   . Pacemaker   . Prostate cancer (CMS/HHS-HCC)    by biopsy 3/25  . Psoriasis   . Sleep apnea     Social History: See HPI above. Additionally: Social History   Socioeconomic History  . Marital status: Married  Tobacco Use  . Smoking status: Former    Current packs/day: 0.00    Average packs/day: 1 pack/day for 4.0 years (4.0 ttl pk-yrs)    Types: Cigarettes    Start date: 02/11/1975    Quit date: 02/11/1979    Years since quitting: 45.1    Passive exposure: Past  Vaping Use  . Vaping status: Never Used  Substance and Sexual Activity  . Alcohol use: No  . Drug use: Never  . Sexual activity: Defer   Social Drivers of Health   Financial Resource Strain: Low Risk  (03/19/2024)   Overall Financial Resource Strain (CARDIA)   . Difficulty of Paying Living Expenses: Not hard at all  Food Insecurity: No Food Insecurity (03/19/2024)   Hunger Vital Sign   . Worried About Programme Researcher, Broadcasting/film/video in the Last Year: Never true   . Ran Out of Food in the Last Year: Never true  Transportation Needs: No Transportation Needs (03/19/2024)   PRAPARE - Transportation   . Lack of Transportation (Medical): No   . Lack of Transportation (Non-Medical): No  Housing Stability: Low Risk  (04/12/2024)   Housing Stability Vital Sign   . Unable to Pay for Housing in the Last Year: No   . Number of Times Moved in the Last Year: 0   . Homeless in the Last Year: No    Family History: See HPI above. Additionally: Family History  Problem Relation Name Age of Onset  . Heart disease Mother    . Brain cancer Father    . Brain hemorrhage Father    . Kidney disease Sister    . Breast cancer Sister      Review of Systems:  See HPI above. Additionally: Constitutional: no fevers or chills Eyes: no diplopia ENT: no epistaxis Cardiovascular: See HPI above Respiratory: See HPI above Gastrointestinal: no BRBPR Genitourinary: no hematuria Musculoskeletal: no joint swelling Skin: no rash Neurological: no strokelike sx Psychiatric: no  SI Endocrine: no night sweats Hematologic/Lymphatic: no bleeding Allergic/Immunologic: See allergies above   Physical Exam   Vitals:   04/12/24 1137  BP: 124/72  Pulse: 67   Body mass index is 24.55 kg/m.  Wt Readings from Last 3 Encounters:  03/19/24 79.8 kg (176 lb)  08/04/14 84.7 kg (186 lb 12.8 oz)  02/04/14 83.9 kg (185 lb)   BP Readings from Last 3 Encounters:  04/12/24 124/72  03/19/24 120/68  08/04/14 122/64   Pulse Readings from Last 3 Encounters:  04/12/24 67  03/19/24 71  08/04/14 64     Gen: NAD Neck: No JVD CV: nl s1s2, RRR, no significant murmur Resp: CTAB, nl effort GI: abd soft, NT Skin: warm Edema: 1+ B LE edema MSK: no obvious deformity Psych: nl affect Neuro: awake and alert Endo: no thyromegaly Lymph: no cervical LAD   Data/Results   Recent Labs    03/19/24 0901  CHOLTOTAL 99*  HDL 38.5  LDLCALC 36  VLDL 24  TRIG 122    Recent Labs    03/19/24 0901  NA 137  K 4.4  BUN 19  CREATININE 0.7  CO2 29.7  GLUCOSE 89  ALT 13  AST 17  TBILI 0.4  ALB 4.1    Recent Labs    03/19/24 0901  WBC 4.9  HGB 13.4*  HCT 39.4*  MCV 94.9  PLT 232    Recent Labs    03/19/24 0901  TSH 3.456  HGBA1C 5.4      ECG: I personally interpreted. ApVp rhythm  Assessment   83 y.o. male patient with:  -SSS s/p dual chamber PPM  -HLD -current long term use of ASA -former tobacco use  Plan   -obtain echo -stop ASA - I can find no compelling indication for longterm ASA in him -very short duration of AF seen on device interrogation, <0.1% burden overall - trial data (eg NOAH, ARTESIA) support decision to continue to hold  off on a/c for now -f/u six mos   Attestation Statement:   I personally performed the service. (TP)  DEARL LEAVEN, MD   Dearl Leaven MD MHS [facility] Assistant Professor of Medicine, Division of Cardiology [facility]

## 2024-04-15 DIAGNOSIS — C61 Malignant neoplasm of prostate: Secondary | ICD-10-CM | POA: Insufficient documentation

## 2024-04-15 NOTE — Progress Notes (Unsigned)
 04/21/24 11:01 AM   Billy Glass 1941-03-28 [id number]   HPI: 83 y.o. male here for initial evaluation of prostate cancer  History of very low risk prostate cancer  - Followed by [facility] urology  - S/p MRI fusion biopsy 08/04/2023 - GS6 in 2/14 cores (up to ~5% involvement)  Accompanied by his daughter today, very pleasant people They have transferred from [facility] and wish to establish care here in [place] Denies any recent change in LUTS, urinary symptoms-particularly around time of recent PSA collection Overall feeling well, no complaints today  History of diastolic heart failure, CAD, hypertension, cardiac pacemaker, OSA on CPAP   PMH: Past Medical History:  Diagnosis Date   Acute bronchitis    Asthma    Atrial flutter (HCC)    Chronic diastolic heart failure (HCC)    Chronic sinusitis    Coronary artery disease    Cough    GERD (gastroesophageal reflux disease)    Resolved   Hypertension    Resolved   Presence of permanent cardiac pacemaker 2011   Replaced 05/15/20   Prostate CA (HCC)    Sinus node dysfunction (HCC)    Wears dentures    full upper and lower    Surgical History: Past Surgical History:  Procedure Laterality Date   CARDIAC CATHETERIZATION  2008   CATARACT EXTRACTION W/PHACO Left 09/26/2021   Procedure: CATARACT EXTRACTION PHACO AND INTRAOCULAR LENS PLACEMENT (IOC) LEFT;  Surgeon: Mittie Gaskin, MD;  Location: [facility];  Service: Ophthalmology;  Laterality: Left;  19.00 1:52.9   CATARACT EXTRACTION W/PHACO Right 10/10/2021   Procedure: CATARACT EXTRACTION PHACO AND INTRAOCULAR LENS PLACEMENT (IOC) RIGHT;  Surgeon: Mittie Gaskin, MD;  Location: [facility];  Service: Ophthalmology;  Laterality: Right;  15.74 01:33.1   INSERT / REPLACE / REMOVE PACEMAKER  2011   Replaced 05/15/20   PACEMAKER INSERTION     SINUS IRRIGATION      Family History: Family History  Problem Relation Age of Onset   Hypertension Mother      Social History:  reports that he quit smoking about 55 years ago. His smoking use included cigarettes. He started smoking about 61 years ago. He has a 6 pack-year smoking history. He has never used smokeless tobacco. He reports that he does not drink alcohol and does not use drugs.      Physical Exam: BP 119/73 (BP Location: Left Arm, Patient Position: Sitting, Cuff Size: Normal)   Pulse 70   Wt 174 lb (78.9 kg)   SpO2 95%   BMI 24.27 kg/m    Constitutional:  Alert and oriented, No acute distress. Cardiovascular: No clubbing, cyanosis, or edema. Respiratory: Normal respiratory effort, no increased work of breathing. GI: Nondistended Skin: No rashes, bruises or suspicious lesions. Neurologic: Grossly intact, no focal deficits, moving all 4 extremities. Psychiatric: Normal mood and affect.  Laboratory Data: Lab Results  Component Value Date/Time    PSA 8.38 (H) 05/17/2021 11:17 AM    PSA 9.65 (H) 04/03/2020 09:53 AM    PSA 9.02 (H) 10/01/2019 11:16 AM    PSA 8.85 (H) 04/19/2019 03:09 PM    PSA 7.57 (H) 06/04/2018 10:36 AM    PSA 6.55 (H) 12/01/2017 02:58 PM    PSA 6.39 (H) 05/29/2017 11:21 AM    PSA 5.30 (H) 12/25/2016 10:45 AM    PSA 7.19 (H) 04/22/2016 02:15 PM    PSA 5.07 (H) 01/12/2016 08:56 AM    PSA 5.29 (H) 01/04/2016 11:15 AM  PSA 5.26 (H) 10/02/2015 03:57 PM    PSA 4.55 (H) 03/21/2015 08:51 AM    PSA 4.35 (H) 09/15/2014 09:36 AM    PSADIAG 4.2 (H) 03/31/2014 10:25 AM    PSASCRN 4.4 (H) 10/19/2013 10:15 AM    PSASCRN 3.9 06/10/2013 09:06 AM    PSASCRN 3.9 09/30/2012 02:12 PM   Component Ref Range & Units 3 wk ago  PSA (Prostate Specific Antigen), Total 0.10 - 4.00 ng/mL 17.03 High      Pertinent Imaging: N/A    Assessment & Plan:    Prostate cancer [facility]) Assessment & Plan: Very low risk prostate cancer (initially dx 2018)  - Followed by [facility] urology  - S/p MRI fusion biopsy 08/04/2023 - GS6 in 2/14 cores (up to ~5% involvement)   PSA 17.03 (Nov  2025)  - from 6-9 baseline since 2019  Reviewed his clinical history and data from [facility].  He appears to have a history of stable low risk prostate cancer initially diagnosed in 2018, reconfirmed on MRI fusion biopsy earlier this year March 2025.  However, he had a recent PSA value acutely elevated to 17 from a prior baseline of 6-9.  I think a higher likelihood of this representing a benign or transient elevation.  Recommend repeat PSA in 4-6 weeks.    - UA today - repeat PSA in ~4-6 . F/u with me after to review results.  Will maintain a high threshold to repeat an aggressive workup.    Orders: -     Urinalysis, Complete -     PSA; Future      [name], MD 04/21/2024  [facility] [address], [address] [place], [place] [address] [phone]

## 2024-04-15 NOTE — Assessment & Plan Note (Addendum)
 Very low risk prostate cancer (initially dx 2018)  - Followed by [facility] urology  - S/p MRI fusion biopsy 08/04/2023 - GS6 in 2/14 cores (up to ~5% involvement)   PSA 17.03 (Nov 2025)  - from 6-9 baseline since 2019  Reviewed his clinical history and data from [facility].  He appears to have a history of stable low risk prostate cancer initially diagnosed in 2018, reconfirmed on MRI fusion biopsy earlier this year March 2025.  However, he had a recent PSA value acutely elevated to 17 from a prior baseline of 6-9.  I think a higher likelihood of this representing a benign or transient elevation.  Recommend repeat PSA in 4-6 weeks.    - UA today - repeat PSA in ~4-6 . F/u with me after to review results.  Will maintain a high threshold to repeat an aggressive workup.

## 2024-04-21 ENCOUNTER — Ambulatory Visit: Admitting: Urology

## 2024-04-21 VITALS — BP 119/73 | HR 70 | Wt 174.0 lb

## 2024-04-21 DIAGNOSIS — C61 Malignant neoplasm of prostate: Secondary | ICD-10-CM

## 2024-04-21 LAB — URINALYSIS, COMPLETE
Bilirubin, UA: NEGATIVE
Glucose, UA: NEGATIVE
Leukocytes,UA: NEGATIVE
Nitrite, UA: NEGATIVE
Protein,UA: NEGATIVE
RBC, UA: NEGATIVE
Specific Gravity, UA: 1.02 (ref 1.005–1.030)
Urobilinogen, Ur: 0.2 mg/dL (ref 0.2–1.0)
pH, UA: 6 (ref 5.0–7.5)

## 2024-04-21 LAB — MICROSCOPIC EXAMINATION

## 2024-05-25 ENCOUNTER — Other Ambulatory Visit

## 2024-05-25 DIAGNOSIS — C61 Malignant neoplasm of prostate: Secondary | ICD-10-CM

## 2024-05-26 LAB — PSA: Prostate Specific Ag, Serum: 20.1 ng/mL — ABNORMAL HIGH (ref 0.0–4.0)

## 2024-05-27 NOTE — Assessment & Plan Note (Addendum)
 Very low risk prostate cancer (initially dx 2018)  - Followed by [facility] urology  - S/p MRI fusion biopsy 08/04/2023 - GS6 in 2/14 cores (up to ~5% involvement)   PSA 17.03 (Nov 2025) PSA 20.1 (Jan 2026)  - from 6-9 baseline since 2019  Sustained PSA increase off baseline, unclear reason. Prior UAs do not suggest infection/inflammation. Most recent fusion biopsy was ~8 months ago and very reassuring.  Reviewed options: Continue PSA surveillance, shared decision making regarding threshold to repeat a prostate biopsy.  I think reasonable to recheck PSA in 6 months, if persistent elevation or continued uptrend--may consider 1 final confirmation biopsy.   - Repeat PSA in 6 months with return visit.  If persistent elevation or uptrend-will consider repeat 12 core biopsy.

## 2024-05-27 NOTE — Progress Notes (Signed)
" ° °  06/02/2024 9:59 AM   Billy Glass 09/20/1940 [id number]  Reason for visit: Follow up prostate Ca   HPI: 84 y.o. male, follow up with me today  Accompanied by daughter today No interval GU issues  Prior HPI: History of very low risk prostate cancer  - Followed by [facility] urology  - S/p MRI fusion biopsy 08/04/2023 - GS6 in 2/14 cores (up to ~5% involvement)   Accompanied by his daughter today, very pleasant people They have transferred from [facility] and wish to establish care here in [place] Denies any recent change in LUTS, urinary symptoms-particularly around time of recent PSA collection Overall feeling well, no complaints today   History of diastolic heart failure, CAD, hypertension, cardiac pacemaker, OSA on CPAP    Physical Exam: BP 128/76   Pulse 81   Ht 5' 11 (1.803 m)   Wt 174 lb (78.9 kg)   BMI 24.27 kg/m    Constitutional:  Alert and oriented, No acute distress.  Laboratory Data: Component Ref Range & Units (hover) 2 d ago  Prostate Specific Ag, Serum 20.1 High    Lab Results  Component Value Date/Time    PSA 8.38 (H) 05/17/2021 11:17 AM    PSA 9.65 (H) 04/03/2020 09:53 AM    PSA 9.02 (H) 10/01/2019 11:16 AM    PSA 8.85 (H) 04/19/2019 03:09 PM    PSA 7.57 (H) 06/04/2018 10:36 AM    PSA 6.55 (H) 12/01/2017 02:58 PM    PSA 6.39 (H) 05/29/2017 11:21 AM    PSA 5.30 (H) 12/25/2016 10:45 AM    PSA 7.19 (H) 04/22/2016 02:15 PM    PSA 5.07 (H) 01/12/2016 08:56 AM    PSA 5.29 (H) 01/04/2016 11:15 AM    PSA 5.26 (H) 10/02/2015 03:57 PM    PSA 4.55 (H) 03/21/2015 08:51 AM    PSA 4.35 (H) 09/15/2014 09:36 AM    PSADIAG 4.2 (H) 03/31/2014 10:25 AM    PSASCRN 4.4 (H) 10/19/2013 10:15 AM    PSASCRN 3.9 06/10/2013 09:06 AM    Pertinent Imaging: N/A    Assessment & Plan:    Prostate cancer [facility]) Assessment & Plan: Very low risk prostate cancer (initially dx 2018)  - Followed by [facility] urology  - S/p MRI fusion biopsy 08/04/2023 - GS6 in 2/14 cores (up  to ~5% involvement)   PSA 17.03 (Nov 2025) PSA 20.1 (Jan 2026)  - from 6-9 baseline since 2019  Sustained PSA increase off baseline, unclear reason. Prior UAs do not suggest infection/inflammation. Most recent fusion biopsy was ~8 months ago and very reassuring.  Reviewed options: Continue PSA surveillance, shared decision making regarding threshold to repeat a prostate biopsy.  I think reasonable to recheck PSA in 6 months, if persistent elevation or continued uptrend--may consider 1 final confirmation biopsy.   - Repeat PSA in 6 months with return visit.  If persistent elevation or uptrend-will consider repeat 12 core biopsy.      Orders: -     PSA; Future       Penne JONELLE Skye, MD  [facility] [address] [place], [place] [address] [phone] "

## 2024-06-02 ENCOUNTER — Ambulatory Visit: Admitting: Urology

## 2024-06-02 VITALS — BP 128/76 | HR 81 | Ht 71.0 in | Wt 174.0 lb

## 2024-06-02 DIAGNOSIS — C61 Malignant neoplasm of prostate: Secondary | ICD-10-CM | POA: Diagnosis not present

## 2024-11-24 ENCOUNTER — Other Ambulatory Visit

## 2024-12-01 ENCOUNTER — Ambulatory Visit: Admitting: Urology
